# Patient Record
Sex: Male | Born: 1960 | Race: White | Hispanic: No | Marital: Married | Smoking: Never smoker
Health system: Southern US, Community
[De-identification: ages and names within clinical notes are randomized; demographics above are authoritative.]

## PROBLEM LIST (undated history)

## (undated) DIAGNOSIS — I5030 Unspecified diastolic (congestive) heart failure: Secondary | ICD-10-CM

## (undated) DIAGNOSIS — G4730 Sleep apnea, unspecified: Secondary | ICD-10-CM

## (undated) DIAGNOSIS — E785 Hyperlipidemia, unspecified: Secondary | ICD-10-CM

## (undated) DIAGNOSIS — I6529 Occlusion and stenosis of unspecified carotid artery: Secondary | ICD-10-CM

## (undated) DIAGNOSIS — I509 Heart failure, unspecified: Secondary | ICD-10-CM

## (undated) DIAGNOSIS — I872 Venous insufficiency (chronic) (peripheral): Secondary | ICD-10-CM

## (undated) DIAGNOSIS — E7800 Pure hypercholesterolemia, unspecified: Secondary | ICD-10-CM

## (undated) DIAGNOSIS — I219 Acute myocardial infarction, unspecified: Secondary | ICD-10-CM

## (undated) DIAGNOSIS — M7120 Synovial cyst of popliteal space [Baker], unspecified knee: Secondary | ICD-10-CM

## (undated) HISTORY — DX: Occlusion and stenosis of unspecified carotid artery: I65.29

## (undated) HISTORY — DX: Acute myocardial infarction, unspecified: I21.9

## (undated) HISTORY — DX: Pure hypercholesterolemia, unspecified: E78.00

## (undated) HISTORY — DX: Sleep apnea, unspecified: G47.30

## (undated) HISTORY — DX: Heart failure, unspecified: I50.9

## (undated) HISTORY — DX: Synovial cyst of popliteal space (Baker), unspecified knee: M71.20

## (undated) HISTORY — PX: KNEE SURGERY: SHX244

## (undated) HISTORY — DX: Hyperlipidemia, unspecified: E78.5

## (undated) HISTORY — DX: Venous insufficiency (chronic) (peripheral): I87.2

## (undated) HISTORY — PX: CORONARY ANGIOPLASTY: SHX604

## (undated) HISTORY — DX: Unspecified diastolic (congestive) heart failure: I50.30

---

## 2013-11-04 DIAGNOSIS — G609 Hereditary and idiopathic neuropathy, unspecified: Secondary | ICD-10-CM | POA: Insufficient documentation

## 2013-11-04 DIAGNOSIS — M5416 Radiculopathy, lumbar region: Secondary | ICD-10-CM | POA: Insufficient documentation

## 2013-11-04 HISTORY — DX: Radiculopathy, lumbar region: M54.16

## 2013-11-04 HISTORY — DX: Hereditary and idiopathic neuropathy, unspecified: G60.9

## 2014-02-13 ENCOUNTER — Ambulatory Visit: Payer: Self-pay

## 2014-02-13 ENCOUNTER — Ambulatory Visit (INDEPENDENT_AMBULATORY_CARE_PROVIDER_SITE_OTHER): Payer: BC Managed Care – PPO

## 2014-02-13 VITALS — BP 140/84 | HR 69 | Resp 12

## 2014-02-13 DIAGNOSIS — M7730 Calcaneal spur, unspecified foot: Secondary | ICD-10-CM

## 2014-02-13 DIAGNOSIS — M722 Plantar fascial fibromatosis: Secondary | ICD-10-CM

## 2014-02-13 DIAGNOSIS — R52 Pain, unspecified: Secondary | ICD-10-CM

## 2014-02-13 MED ORDER — MELOXICAM 15 MG PO TABS: 15.0000 mg | ORAL_TABLET | Freq: Every day | ORAL | Status: DC

## 2014-02-13 NOTE — Patient Instructions (Signed)

## 2014-02-13 NOTE — Progress Notes (Signed)
   Subjective:    Patient ID: Jerome Phelps, male    DOB: 08/28/1960, 53 y.o.   MRN: [id number]  HPI    Review of Systems  Musculoskeletal: Positive for joint swelling.  All other systems reviewed and are negative.      Objective:   Physical Exam 53 year old white male well-developed well-nourished considerably obese overweight presents at this time with a recurrence of heel pain left more so than right patient had orthotics in the past however cannot tolerate them they broken down in are worn out. Ask her status appears to be intact with pedal pulses palpable DP +2/4 bilateral PT one over 4 bilateral capillary refill time 3 seconds all digits mild + edema mild varicosities noted neurologically epicritic and proprioceptive sensations intact and symmetric bilateral there is normal plantar response DTRs not elicited dermatologically skin color pigment normal hair growth absent nails somewhat criptotic incurvated friable orthopedic biomechanical exam there is pain on palpation of the mid arch bilateral left much more so than right pain on first up in the morning or getting up from. Breasts is also noted the past and orthotics is not able to tolerate them they broken are worn out. Is wearing shoes or boots that may have to much flexibility or flimsy this in the mid arch also has some gel insoles rather than the orthotics in there which may have contributed to the lack of stability remainder of exam otherwise unremarkable no open wounds or ulcers no secondary infections       Assessment & Plan:  Assessment plantar fasciitis/heel spur syndrome left more so than right plan at this time fascial strapping applied to both feet prescription for [facility] is given recommended ice to the affected areas also will try to resume using orthoses patient will bring orthotics with him for next visit for just under recovering to be able to facilitate use of the orthotic again. Patient advised to avoid barefoot or flimsy  shoes or flip-flops also recommended ice to the heel every evening again recheck in a week or 2 for recheck and orthotic adjustments unavailable  Alvan Dame DPM

## 2014-02-27 ENCOUNTER — Ambulatory Visit (INDEPENDENT_AMBULATORY_CARE_PROVIDER_SITE_OTHER): Payer: BC Managed Care – PPO

## 2014-02-27 VITALS — BP 155/72 | HR 76 | Resp 12

## 2014-02-27 DIAGNOSIS — M7730 Calcaneal spur, unspecified foot: Secondary | ICD-10-CM

## 2014-02-27 DIAGNOSIS — R52 Pain, unspecified: Secondary | ICD-10-CM

## 2014-02-27 DIAGNOSIS — M722 Plantar fascial fibromatosis: Secondary | ICD-10-CM

## 2014-02-27 MED ORDER — TRIAMCINOLONE ACETONIDE 10 MG/ML IJ SUSP: 10.0000 mg | Freq: Once | INTRAMUSCULAR | Status: DC

## 2014-02-27 NOTE — Patient Instructions (Signed)
WEARING INSTRUCTIONS FOR ORTHOTICS  Don't expect to be comfortable wearing your orthotic devices for the first time.  Like eyeglasses, you may be aware of them as time passes, they will not be uncomfortable and you will enjoy wearing them.  FOLLOW THESE INSTRUCTIONS EXACTLY!  1. Wear your orthotic devices for:       Not more than 1 hour the first day.       Not more than 2 hours the second day.       Not more than 3 hours the third day and so on.        Or wear them for as long as they feel comfortable.       If you experience discomfort in your feet or legs take them out.  When feet & legs feel       better, put them back in.  You do need to be consistent and wear them a little        everyday. 2.   If at any time the orthotic devices become acutely uncomfortable before the       time for that particular day, STOP WEARING THEM. 3.   On the next day, do not increase the wearing time. 4.   Subsequently, increase the wearing time by 15-30 minutes only if comfortable to do       so. 5.   You will be seen by your doctor about 2-4 weeks after you receive your orthotic       devices, at which time you will probably be wearing your devices comfortably        for about 8 hours or more a day. 6.   Some patients occasionally report mild aches or discomfort in other parts of the of       body such as the knees, hips or back after 3 or 4 consecutive hours of wear.  If this       is the case with you, do not extend your wearing time.  Instead, cut it back an hour or       two.  In all likelihood, these symptoms will disappear in a short period of time as your       body posture realigns itself and functions more efficiently. 7.   It is possible that your orthotic device may require some small changes or adjustment       to improve their function or make them more comfortable.   This is usually not done       before one to three months have elapsed.  These adjustments are made in        accordance  with the changed position your feet are assuming as a result of       improved biomechanical function. 8.   In women's shoes, it's not unusual for your heel to slip out of the shoe, particularly if       they are step-in-shoes.  If this is the case, try other shoes or other styles.  Try to       purchase shoes which have deeper heal seats or higher heel counters. 9.   Squeaking of orthotics devices in the shoes is due to the movement of the devices       when they are functioning normally.  To eliminate squeaking, simply dust some       baby powder into your shoes before inserting the devices.  If this does not work,          apply soap or wax to the edges of the orthotic devices or put a tissue into the shoes. 10. It is important that you follow these directions explicitly.  Failure to do so will simply       prolong the adjustment period or create problems which are easily avoided.  It makes       no difference if you are wearing your orthotic devices for only a few hours after        several months, so long as you are wearing them comfortably for those hours. 11. If you have any questions or complaints, contact our office.  We have no way of       knowing about your problems unless you tell us.  If we do not hear from you, we will       assume that you are proceeding well.     ICE INSTRUCTIONS  Apply ice or cold pack to the affected area at least 3 times a day for 10-15 minutes each time.  You should also use ice after prolonged activity or vigorous exercise.  Do not apply ice longer than 20 minutes at one time.  Always keep a cloth between your skin and the ice pack to prevent burns.  Being consistent and following these instructions will help control your symptoms.  We suggest you purchase a gel ice pack because they are reusable and do bit leak.  Some of them are designed to wrap around the area.  Use the method that works best for you.  Here are some other suggestions for icing.   Use a  frozen bag of peas or corn-inexpensive and molds well to your body, usually stays frozen for 10 to 20 minutes.  Wet a towel with cold water and squeeze out the excess until it's damp.  Place in a bag in the freezer for 20 minutes. Then remove and use. 

## 2014-02-27 NOTE — Progress Notes (Signed)
   Subjective:    Patient ID: Jerome Phelps, male    DOB: 11/29/1960, 53 y.o.   MRN: [id number]  HPI ''RT FOOT HEEL IS DOING MUCH BETTER, BUT LT FOOT STILL SORE.''   Review of Systems no new findings or systemic changes noted     Objective:   Physical Exam Neurovascular status is unchanged pedal pulses are palpable the right foot is greatly improved although strapping and taping he resume using orthoses however still and hurts and tolerating on the left the orthotics are worn out and need replacing of top cover with Spenco top cover is worn through the orthotics released to your hip holder more and need replacing at this point. Patient's current cover for orthotics it's not available therefore we'll try to recover the old orthotics refurbished. Again pedal pulses are palpable epicritic and proprioceptive sensations intact mild HAV deformity and digital contractures noted there is pain on palpation medial band plantar fascia medial calcaneal tubercle on the left from heel to mid arch out varicosities and edema noted as well no other new changes are noted at this time injection tender with Kenalog 20 mg Xylocaine plain infiltrated to the inferior left heel fascial strapping is applied and maintained for 5 days as instructed continue with ice and NSAID therapies in the interim the orthotics are taken will be recovered with no Spenco top cover and by myself      Assessment & Plan:  Assessment plantar fasciitis/heel spurs interim responding to fascial strapping of both feet although right more successful left continues to have some pain tenderness discomfort mid arch base and affect more aggressive medications per steroid injections given at this time and fascial strapping reapplied orthotics to be recovered in refurbished and first [facility] patient with the next week reappointed in one month for long-term followup and reevaluation and possible additional steroid injections or other noninvasive or invasive  options based on progress  Alvan Dame DPM

## 2014-03-06 DIAGNOSIS — M722 Plantar fascial fibromatosis: Secondary | ICD-10-CM

## 2014-03-27 ENCOUNTER — Ambulatory Visit: Payer: BC Managed Care – PPO

## 2014-03-30 ENCOUNTER — Ambulatory Visit (INDEPENDENT_AMBULATORY_CARE_PROVIDER_SITE_OTHER): Payer: BC Managed Care – PPO

## 2014-03-30 VITALS — BP 133/76 | HR 84 | Resp 12

## 2014-03-30 DIAGNOSIS — M722 Plantar fascial fibromatosis: Secondary | ICD-10-CM

## 2014-03-30 DIAGNOSIS — M7730 Calcaneal spur, unspecified foot: Secondary | ICD-10-CM

## 2014-03-30 NOTE — Patient Instructions (Signed)

## 2014-03-30 NOTE — Progress Notes (Signed)
   Subjective:    Patient ID: Jerome Phelps, male    DOB: [birth year], 53 y.o.   MRN: [id number]  HPI  ''B/L HEEL ARE DOING OK, EXCEPT THE LT FOOT BOTTOM OF THE HEEL IS STILL SORE.''  Review of Systems no new findings or systemic changes noted     Objective:   Physical Exam neurovascular status is intact and unchanged pedal pulses are palpable patient is considerably overweight the right foot is doing great having had taping ease back into his refurbished orthotics orthotics fit and contour well left foot mid arch area and heel still having slight tenderness and pain special and first up in the morning although not as intense as initially based on that fact vessel strapping is reapplied at this time orthotics are examined and she fit and contour well do not feel any adjusting however if within the next month or 2 they don't resolve the situation completely we consider additional booster cortisone injection and possible new orthoses fabrication if needed      Assessment & Plan:  Assessment persistent recalcitrant plantar fasciitis left foot more so than right right is resolved with orthoses and taping at this time will continue with ice and meloxicam on left foot fascial strapping applied to left foot at this time maintain orthoses reappointed in one to 2 months if fails to resolve completely [place] additional steroid injection or new functional orthoses in the future.  Alvan Dame DPM

## 2016-11-10 ENCOUNTER — Encounter: Payer: Self-pay | Admitting: Cardiology

## 2016-11-10 ENCOUNTER — Ambulatory Visit (INDEPENDENT_AMBULATORY_CARE_PROVIDER_SITE_OTHER): Payer: 59 | Admitting: Cardiology

## 2016-11-10 VITALS — BP 126/72 | HR 60 | Resp 10 | Ht 73.0 in | Wt 367.0 lb

## 2016-11-10 DIAGNOSIS — E785 Hyperlipidemia, unspecified: Secondary | ICD-10-CM | POA: Insufficient documentation

## 2016-11-10 DIAGNOSIS — I2510 Atherosclerotic heart disease of native coronary artery without angina pectoris: Secondary | ICD-10-CM | POA: Diagnosis not present

## 2016-11-10 DIAGNOSIS — G4733 Obstructive sleep apnea (adult) (pediatric): Secondary | ICD-10-CM | POA: Insufficient documentation

## 2016-11-10 DIAGNOSIS — G4730 Sleep apnea, unspecified: Secondary | ICD-10-CM | POA: Insufficient documentation

## 2016-11-10 HISTORY — DX: Hyperlipidemia, unspecified: E78.5

## 2016-11-10 HISTORY — DX: Atherosclerotic heart disease of native coronary artery without angina pectoris: I25.10

## 2016-11-10 MED ORDER — ATORVASTATIN CALCIUM 10 MG PO TABS: 10.0000 mg | ORAL_TABLET | Freq: Every day | ORAL | 6 refills | Status: DC

## 2016-11-10 NOTE — Patient Instructions (Addendum)
Medication Instructions:  Your physician has recommended you make the following change in your medication: Dr. Bing Matter would like you to start a medication called Lipitor 10 mg daily. This will help in reducing cholesterol and reduce plaque build up in your arteries.  Labwork: Your physician recommends that you return for lab work in: today  Testing/Procedures: Your physician has requested that you have an echocardiogram. Echocardiography is a painless test that uses sound waves to create images of your heart. It provides your doctor with information about the size and shape of your heart and how well your heart's chambers and valves are working. This procedure takes approximately one hour. There are no restrictions for this procedure.  Follow-Up: Your physician recommends that you schedule a follow-up appointment in: 1 month   Any Other Special Instructions Will Be Listed Below (If Applicable).     If you need a refill on your cardiac medications before your next appointment, please call your pharmacy.

## 2016-11-10 NOTE — Progress Notes (Signed)
Cardiology Consultation:    Date:  11/10/2016   ID:  Kasin Rivest, DOB 01/26/1961, MRN [id number]  PCP:  Noni Saupe, MD  Cardiologist:  Gypsy Balsam, MD   Referring MD: Noni Saupe, MD   Chief Complaint  Patient presents with  . Coronary Artery Disease  To be reestablishes a patient with coronary artery disease  History of Present Illness:    Darcy Toor is a 56 y.o. male who is being seen today for the evaluation of Coronary artery disease at the request of [place], John F. II, MD. Apparently 4 years ago he had cardiac catheterization which showed 20% blockage in the left anterolateral descending artery. He is was referred to his back because of some contemplation about potentially doing his knee surgery. He is morbidly obese and does have significant arthritis in both knees and both need to be replaced. However, orthopedic surgeon told him he needs to lose 100 pounds in overlying to have surgery. She went on diet and already making some progress in losing weight. Does have history of chronic swelling of lower extremities. Within last few years he had bilateral DVT study done in every single time tests were normal. He does have obstructive sleep apnea and uses CPAP mask however, he did not want some improvement in the swelling of lower extremities since the time use is mask. Denies having atypical chest pain tightness squeezing pressure burning chest but his exercise tolerance is limited because severe pain in his knees. According to his wife he does not snore anymore as long as he uses CPAP mask. And he uses his CPAP must religiously. His risk factors for coronary artery disease include dyslipidemia. He never smoked. There is some family history of coronary artery disease but none premature.  Past Medical History:  Diagnosis Date  . Carotid artery occlusion   . Hyperlipidemia   . Sleep apnea   . Venous insufficiency     Past Surgical History:  Procedure Laterality  Date  . CORONARY ANGIOPLASTY     Right/Left  . KNEE SURGERY      Current Medications: Current Meds  Medication Sig  . aspirin EC 325 MG tablet Take by mouth.  . Multiple Vitamin (MULTI-VITAMINS) TABS Take 1 tablet by mouth daily.   . tamsulosin (FLOMAX) 0.4 MG CAPS capsule Take 1 capsule by mouth daily.   Current Facility-Administered Medications for the 11/10/16 encounter (Office Visit) with Georgeanna Lea, MD  Medication  . triamcinolone acetonide (KENALOG) 10 MG/ML injection 10 mg     Allergies:   Patient has no known allergies.   Social History   Social History  . Marital status: Married    Spouse name: N/A  . Number of children: N/A  . Years of education: N/A   Social History Main Topics  . Smoking status: Never Smoker  . Smokeless tobacco: Never Used  . Alcohol use No  . Drug use: No  . Sexual activity: Not Asked   Other Topics Concern  . None   Social History Narrative  . None     Family History: The patient's family history includes Lung cancer in his father; Thyroid cancer in his mother. ROS:   Please see the history of present illness.     All other systems reviewed and are negative.  EKGs/Labs/Other Studies Reviewed:    The following studies were reviewed today: Cardiac catheterization from 2014 showing luminal disease up to 20% of LAD.  EKG:  Normal sinus rhythm,  normal interval, normal. Discomfort is exertional. No ST-T segment changes.  Recent Labs: No results found for requested labs within last 8760 hours.  Recent Lipid Panel No results found for: CHOL, TRIG, HDL, CHOLHDL, VLDL, LDLCALC, LDLDIRECT  Physical Exam:    VS:  BP 126/72   Pulse 60   Resp 10   Ht 6\' 1"  (1.854 m)   Wt (!) 367 lb (166.5 kg)   BMI 48.42 kg/m     Wt Readings from Last 3 Encounters:  11/10/16 (!) 367 lb (166.5 kg)     GEN:  Well nourished, well developed in no acute distress HEENT: Normal NECK: No JVD; No carotid bruits LYMPHATICS: No  lymphadenopathy CARDIAC: RRR, no murmurs, no rubs, no gallops RESPIRATORY:  Clear to auscultation without rales, wheezing or rhonchi  ABDOMEN: Soft, non-tender, non-distended MUSCULOSKELETAL:  No edema; No deformity  SKIN: Warm and dry NEUROLOGIC:  Alert and oriented x 3 PSYCHIATRIC:  Normal affect   ASSESSMENT:    No diagnosis found. PLAN:    In order of problems listed above:  1. Coronary artery disease as proven by cardiac catheterization done 4 years ago. He did have 20% stenosis of left internal descending artery. Does not report any typical symptoms but because of these pain in both knee secondary to osteoarthritis he does not have good exercise tolerance. I think the key will be to modify his risk factors for coronary artery disease. He is already on  aspirin and advised to continue. According to guidelines he needs to be in high intensity statin. He's reluctant to go on very high dose I will initially put him only on 10 mg of Lipitor. I do have his cholesterol profile he is HDL was 50 and LDL 82. 2 Dyslipidemia. Last fasting lipid profile which was done this year show LDH 82 and HDL 50. According to guidelines he needs to be in high intensity statin I will put him on 10 mg of Lipitor initially with intention to increase the dose if he is able to tolerated. 3. Swelling of lower extremity venous. Echocardiography will be done. He does use CPAP must persist sleep apnea. I would check left ventricular ejection fraction as well as pulmonology pressure. I will ask him to have proBNP as well as a PA and aortic done. Ascription for panendoscopy to will be sent.  Medication Adjustments/Labs and Tests Ordered: Current medicines are reviewed at length with the patient today.  Concerns regarding medicines are outlined above.  No orders of the defined types were placed in this encounter.  No orders of the defined types were placed in this encounter.   Signed, Georgeanna Lea, MD,  [facility]. 11/10/2016 2:17 PM    [facility]

## 2016-11-11 LAB — BASIC METABOLIC PANEL
BUN / CREAT RATIO: 14 (ref 9–20)
BUN: 15 mg/dL (ref 6–24)
CALCIUM: 8.7 mg/dL (ref 8.7–10.2)
CO2: 22 mmol/L (ref 20–29)
CREATININE: 1.06 mg/dL (ref 0.76–1.27)
Chloride: 104 mmol/L (ref 96–106)
GFR, EST AFRICAN AMERICAN: 89 mL/min/{1.73_m2} (ref >59)
GFR, EST NON AFRICAN AMERICAN: 77 mL/min/{1.73_m2} (ref >59)
Glucose: 86 mg/dL (ref 65–99)
Potassium: 5.1 mmol/L (ref 3.5–5.2)
Sodium: 141 mmol/L (ref 134–144)

## 2016-11-11 LAB — AST: AST: 16 IU/L (ref 0–40)

## 2016-11-11 LAB — ALT: ALT: 20 IU/L (ref 0–44)

## 2016-11-11 LAB — PRO B NATRIURETIC PEPTIDE: NT-Pro BNP: 64 pg/mL (ref 0–210)

## 2016-11-23 ENCOUNTER — Ambulatory Visit (HOSPITAL_BASED_OUTPATIENT_CLINIC_OR_DEPARTMENT_OTHER)
Admission: RE | Admit: 2016-11-23 | Discharge: 2016-11-23 | Disposition: A | Discharge status: Home / Self Care | Payer: 59 | Source: Ambulatory Visit | Attending: Cardiology | Admitting: Cardiology

## 2016-11-23 DIAGNOSIS — I5030 Unspecified diastolic (congestive) heart failure: Secondary | ICD-10-CM | POA: Diagnosis not present

## 2016-11-23 DIAGNOSIS — I2510 Atherosclerotic heart disease of native coronary artery without angina pectoris: Secondary | ICD-10-CM | POA: Diagnosis not present

## 2016-11-23 DIAGNOSIS — I420 Dilated cardiomyopathy: Secondary | ICD-10-CM | POA: Diagnosis not present

## 2016-11-23 NOTE — Progress Notes (Signed)
  Echocardiogram 2D Echocardiogram has been performed.  Arvil Chaco 11/23/2016, 9:57 AM

## 2016-12-11 ENCOUNTER — Ambulatory Visit (INDEPENDENT_AMBULATORY_CARE_PROVIDER_SITE_OTHER): Payer: 59 | Admitting: Cardiology

## 2016-12-11 ENCOUNTER — Encounter: Payer: Self-pay | Admitting: Cardiology

## 2016-12-11 VITALS — BP 122/64 | HR 76 | Resp 10 | Ht 73.0 in | Wt 371.1 lb

## 2016-12-11 DIAGNOSIS — I2510 Atherosclerotic heart disease of native coronary artery without angina pectoris: Secondary | ICD-10-CM

## 2016-12-11 DIAGNOSIS — G4733 Obstructive sleep apnea (adult) (pediatric): Secondary | ICD-10-CM

## 2016-12-11 DIAGNOSIS — E785 Hyperlipidemia, unspecified: Secondary | ICD-10-CM | POA: Diagnosis not present

## 2016-12-11 HISTORY — DX: Morbid (severe) obesity due to excess calories: E66.01

## 2016-12-11 NOTE — Progress Notes (Signed)
Cardiology Office Note:    Date:  12/11/2016   ID:  Jerome Phelps, DOB 24-Jan-1961, MRN [id number]  PCP:  Noni Saupe, MD  Cardiologist:  Gypsy Balsam, MD    Referring MD: Noni Saupe, MD   Chief Complaint  Patient presents with  . 1 month follow up    History of Present Illness:    Jerome Phelps is a 56 y.o. male  with coronary artery disease. He is asymptomatic. His ability to exercise is severely limited because of his morbid obesity. He came to me because of some contemplation of knee surgery however surgeon told him that he must lose 100 pounds before having surgery. He is working on a chance she is diet and lost some weight but still frustrated with the following that he lost about 10 pounds. I started talking to him more about his diet completely he does have some understanding of the problem but not completely anything he somebody who can benefit from dietary consult. He does have motivation with some knowledge she will be able to accomplish his goal. He had echocardiogram done which I will review with him show preserved left ventricular ejection fraction, no mentioning of pulmonary hypertension number itching of TR which make me think that there is no significant critical pulmonary hypertension.  Past Medical History:  Diagnosis Date  . Carotid artery occlusion   . Hyperlipidemia   . Sleep apnea   . Venous insufficiency     Past Surgical History:  Procedure Laterality Date  . CORONARY ANGIOPLASTY     Right/Left  . KNEE SURGERY      Current Medications: No outpatient prescriptions have been marked as taking for the 12/11/16 encounter (Office Visit) with Georgeanna Lea, MD.   Current Facility-Administered Medications for the 12/11/16 encounter (Office Visit) with Georgeanna Lea, MD  Medication  . triamcinolone acetonide (KENALOG) 10 MG/ML injection 10 mg     Allergies:   Patient has no known allergies.   Social History   Social History  .  Marital status: Married    Spouse name: N/A  . Number of children: N/A  . Years of education: N/A   Social History Main Topics  . Smoking status: Never Smoker  . Smokeless tobacco: Never Used  . Alcohol use No  . Drug use: No  . Sexual activity: Not Asked   Other Topics Concern  . None   Social History Narrative  . None     Family History: The patient's family history includes Lung cancer in his father; Thyroid cancer in his mother. ROS:   Please see the history of present illness.    All 14 point review of systems negative except as described per history of present illness  EKGs/Labs/Other Studies Reviewed:      Recent Labs: 11/10/2016: ALT 20; BUN 15; Creatinine, Ser 1.06; NT-Pro BNP 64; Potassium 5.1; Sodium 141  Recent Lipid Panel No results found for: CHOL, TRIG, HDL, CHOLHDL, VLDL, LDLCALC, LDLDIRECT  Physical Exam:    VS:  BP 122/64   Pulse 76   Resp 10   Ht 6\' 1"  (1.854 m)   Wt (!) 371 lb 1.9 oz (168.3 kg)   BMI 48.96 kg/m     Wt Readings from Last 3 Encounters:  12/11/16 (!) 371 lb 1.9 oz (168.3 kg)  11/10/16 (!) 367 lb (166.5 kg)     GEN:  Well nourished, well developed in no acute distress HEENT: Normal NECK: No JVD; No carotid  bruits LYMPHATICS: No lymphadenopathy CARDIAC: RRR, no murmurs, no rubs, no gallops RESPIRATORY:  Clear to auscultation without rales, wheezing or rhonchi  ABDOMEN: Soft, non-tender, non-distended MUSCULOSKELETAL:  No edema; No deformity  SKIN: Warm and dry LOWER EXTREMITIES: no swelling NEUROLOGIC:  Alert and oriented x 3 PSYCHIATRIC:  Normal affect   ASSESSMENT:    1. Coronary artery disease involving native coronary artery of native heart without angina pectoris   2. Obstructive sleep apnea   3. Dyslipidemia   4. Morbid obesity (HCC)    PLAN:    In order of problems listed above:  1. Coronary artery disease: Asymptomatic we'll continue present management. 2. Obstructive sleep apnea: He does use must  religiously and advised to continue. 3. Dyslipidemia: Complaining of having some leg pain he thinks this is because of Lipitor asked him to continue for another 2 weeks and if it still a problem to stop Lipitor for 2 weeks to see if he is improving. 4. Morbid obesity: We'll try to find out if we can get him appointment with dietitian.   Medication Adjustments/Labs and Tests Ordered: Current medicines are reviewed at length with the patient today.  Concerns regarding medicines are outlined above.  No orders of the defined types were placed in this encounter.  Medication changes: No orders of the defined types were placed in this encounter.   Signed, Georgeanna Lea, MD, [facility] 12/11/2016 3:17 PM    [facility]

## 2016-12-11 NOTE — Patient Instructions (Signed)
Medication Instructions:  Your physician recommends that you continue on your current medications as directed. Please refer to the Current Medication list given to you today.  Labwork: None   Testing/Procedures: None    Follow-Up: Your physician recommends that you schedule a follow-up appointment in: 3 months  Your physician encouraged you to lose weight for better health.Your physician discussed the importance of regular exercise and recommended that you start or continue a regular exercise program for good health. A referral has been made to a dietician who should be contacting you.    Any Other Special Instructions Will Be Listed Below (If Applicable).  Please note that any paperwork needing to be filled out by the provider will need to be addressed at the front desk prior to seeing the provider. Please note that any paperwork FMLA, Disability or other documents regarding health condition is subject to a $25.00 charge that must be received prior to completion of paperwork.    If you need a refill on your cardiac medications before your next appointment, please call your pharmacy.

## 2016-12-11 NOTE — Addendum Note (Signed)
Addended by: Rodney Langton on: 12/11/2016 03:37 PM   Modules accepted: Orders

## 2016-12-27 ENCOUNTER — Telehealth: Payer: Self-pay | Admitting: Cardiology

## 2016-12-27 DIAGNOSIS — E785 Hyperlipidemia, unspecified: Secondary | ICD-10-CM

## 2016-12-27 NOTE — Telephone Encounter (Signed)
Paitent has been without cholesterol meds for 2 weeks that we recommended due to leg pain, He feels better and would like another chol med called to [facility] in [place].  Please call patient.

## 2016-12-29 MED ORDER — ROSUVASTATIN CALCIUM 10 MG PO TABS: 10.0000 mg | ORAL_TABLET | Freq: Every day | ORAL | 3 refills | Status: DC

## 2016-12-29 NOTE — Telephone Encounter (Signed)
Pt educated on new medication start of crestor. RX sent to pharmacy. Pt will notify the office if he is unable to tolerate it.

## 2017-03-13 ENCOUNTER — Ambulatory Visit (INDEPENDENT_AMBULATORY_CARE_PROVIDER_SITE_OTHER): Payer: 59 | Admitting: Cardiology

## 2017-03-13 ENCOUNTER — Encounter: Payer: Self-pay | Admitting: Cardiology

## 2017-03-13 VITALS — BP 130/74 | HR 68 | Resp 12 | Ht 73.0 in | Wt 366.0 lb

## 2017-03-13 DIAGNOSIS — I2510 Atherosclerotic heart disease of native coronary artery without angina pectoris: Secondary | ICD-10-CM | POA: Diagnosis not present

## 2017-03-13 DIAGNOSIS — E785 Hyperlipidemia, unspecified: Secondary | ICD-10-CM

## 2017-03-13 DIAGNOSIS — G4733 Obstructive sleep apnea (adult) (pediatric): Secondary | ICD-10-CM | POA: Diagnosis not present

## 2017-03-13 NOTE — Patient Instructions (Signed)
Medication Instructions:  Your physician recommends that you continue on your current medications as directed. Please refer to the Current Medication list given to you today.  Labwork: Your physician recommends that you return for a FASTING lipid profile: 6 weeks   Testing/Procedures: None   Follow-Up: Your physician wants you to follow-up in: 6 months. You will receive a reminder letter in the mail two months in advance. If you don't receive a letter, please call our office to schedule the follow-up appointment.  Any Other Special Instructions Will Be Listed Below (If Applicable).  Please note that any paperwork needing to be filled out by the provider will need to be addressed at the front desk prior to seeing the provider. Please note that any paperwork FMLA, Disability or other documents regarding health condition is subject to a $25.00 charge that must be received prior to completion of paperwork in the form of a money order or check.    If you need a refill on your cardiac medications before your next appointment, please call your pharmacy.

## 2017-03-13 NOTE — Progress Notes (Signed)
Cardiology Office Note:    Date:  03/13/2017   ID:  Jerome Phelps, DOB 1960/05/10, MRN [id number]  PCP:  Noni Saupe, MD  Cardiologist:  Gypsy Balsam, MD    Referring MD: Noni Saupe, MD   Chief Complaint  Patient presents with  . Follow-up  Doing better  History of Present Illness:    Nachmen Mitschke is a 56 y.o. male with coronary artery disease.  Overall he is doing well cardiac wise denies having any chest pain tightness squeezing pressure burning chest no palpitations no dizziness no swelling of lower extremities.  Working on weight loss then he lost significant amount of weight already feels better less pain in his knees, shortness of breath.  Overall he is doing well.  Complaint of having some soreness in his legs while taking Crestor 10 every other day I told him to reduce Crestor to 5 mg but take it every day.  They will follow-up he is fasting lipid profile.  Past Medical History:  Diagnosis Date  . Carotid artery occlusion   . Hyperlipidemia   . Sleep apnea   . Venous insufficiency     Past Surgical History:  Procedure Laterality Date  . CORONARY ANGIOPLASTY     Right/Left  . KNEE SURGERY      Current Medications: Current Meds  Medication Sig  . aspirin EC 325 MG tablet Take by mouth.  . Multiple Vitamin (MULTI-VITAMINS) TABS Take 1 tablet by mouth daily.   . rosuvastatin (CRESTOR) 10 MG tablet Take 1 tablet (10 mg total) by mouth daily. (Patient taking differently: Take 10 mg every other day by mouth. )  . tamsulosin (FLOMAX) 0.4 MG CAPS capsule Take 1 capsule by mouth daily.   Current Facility-Administered Medications for the 03/13/17 encounter (Office Visit) with Georgeanna Lea, MD  Medication  . triamcinolone acetonide (KENALOG) 10 MG/ML injection 10 mg     Allergies:   Patient has no known allergies.   Social History   Socioeconomic History  . Marital status: Married    Spouse name: None  . Number of children: None  . Years of  education: None  . Highest education level: None  Social Needs  . Financial resource strain: None  . Food insecurity - worry: None  . Food insecurity - inability: None  . Transportation needs - medical: None  . Transportation needs - non-medical: None  Occupational History  . None  Tobacco Use  . Smoking status: Never Smoker  . Smokeless tobacco: Never Used  Substance and Sexual Activity  . Alcohol use: No  . Drug use: No  . Sexual activity: None  Other Topics Concern  . None  Social History Narrative  . None     Family History: The patient's family history includes Lung cancer in his father; Thyroid cancer in his mother. ROS:   Please see the history of present illness.    All 14 point review of systems negative except as described per history of present illness  EKGs/Labs/Other Studies Reviewed:      Recent Labs: 11/10/2016: ALT 20; BUN 15; Creatinine, Ser 1.06; NT-Pro BNP 64; Potassium 5.1; Sodium 141  Recent Lipid Panel No results found for: CHOL, TRIG, HDL, CHOLHDL, VLDL, LDLCALC, LDLDIRECT  Physical Exam:    VS:  BP 130/74   Pulse 68   Resp 12   Ht 6\' 1"  (1.854 m)   Wt (!) 366 lb (166 kg)   BMI 48.29 kg/m  Wt Readings from Last 3 Encounters:  03/13/17 (!) 366 lb (166 kg)  12/11/16 (!) 371 lb 1.9 oz (168.3 kg)  11/10/16 (!) 367 lb (166.5 kg)     GEN:  Well nourished, well developed in no acute distress HEENT: Normal NECK: No JVD; No carotid bruits LYMPHATICS: No lymphadenopathy CARDIAC: RRR, no murmurs, no rubs, no gallops RESPIRATORY:  Clear to auscultation without rales, wheezing or rhonchi  ABDOMEN: Soft, non-tender, non-distended MUSCULOSKELETAL:  No edema; No deformity  SKIN: Warm and dry LOWER EXTREMITIES: no swelling NEUROLOGIC:  Alert and oriented x 3 PSYCHIATRIC:  Normal affect   ASSESSMENT:    1. Coronary artery disease involving native coronary artery of native heart without angina pectoris   2. Obstructive sleep apnea   3.  Dyslipidemia   4. Morbid obesity (HCC)    PLAN:    In order of problems listed above:  1. Coronary artery disease: Doing well asymptomatic continue present management 2. Selective sleep apnea: Follow-up by primary care physicians 3. Dyslipidemia: He is going to take 5 mg Crestor every day we will check fasting lipid profile in 6 weeks 4. Morbid obesity: Working on losing weight and making some progress.   Medication Adjustments/Labs and Tests Ordered: Current medicines are reviewed at length with the patient today.  Concerns regarding medicines are outlined above.  No orders of the defined types were placed in this encounter.  Medication changes: No orders of the defined types were placed in this encounter.   Signed, Georgeanna Leaobert J. Sung [place], MD, [facility] Center[facility]ACC 03/13/2017 2:16 PM    [facility]

## 2017-05-29 DIAGNOSIS — G8929 Other chronic pain: Secondary | ICD-10-CM

## 2017-05-29 HISTORY — DX: Other chronic pain: G89.29

## 2019-03-24 DIAGNOSIS — E786 Lipoprotein deficiency: Secondary | ICD-10-CM | POA: Insufficient documentation

## 2019-03-24 HISTORY — DX: Lipoprotein deficiency: E78.6

## 2019-04-14 ENCOUNTER — Ambulatory Visit: Payer: 59 | Admitting: Cardiology

## 2019-06-12 ENCOUNTER — Other Ambulatory Visit: Payer: Self-pay

## 2019-06-12 ENCOUNTER — Ambulatory Visit (INDEPENDENT_AMBULATORY_CARE_PROVIDER_SITE_OTHER): Payer: 59 | Admitting: Cardiology

## 2019-06-12 ENCOUNTER — Encounter: Payer: Self-pay | Admitting: Cardiology

## 2019-06-12 VITALS — BP 126/78 | HR 71 | Ht 73.0 in | Wt 312.8 lb

## 2019-06-12 DIAGNOSIS — E785 Hyperlipidemia, unspecified: Secondary | ICD-10-CM | POA: Diagnosis not present

## 2019-06-12 DIAGNOSIS — G4733 Obstructive sleep apnea (adult) (pediatric): Secondary | ICD-10-CM | POA: Diagnosis not present

## 2019-06-12 DIAGNOSIS — I2510 Atherosclerotic heart disease of native coronary artery without angina pectoris: Secondary | ICD-10-CM | POA: Diagnosis not present

## 2019-06-12 NOTE — Patient Instructions (Signed)
Medication Instructions:  Your physician recommends that you continue on your current medications as directed. Please refer to the Current Medication list given to you today.  *If you need a refill on your cardiac medications before your next appointment, please call your pharmacy*  Lab Work: None.  If you have labs (blood work) drawn today and your tests are completely normal, you will receive your results only by: Marland Kitchen MyChart Message (if you have MyChart) OR . A paper copy in the mail If you have any lab test that is abnormal or we need to change your treatment, we will call you to review the results.  Testing/Procedures: Your physician has requested that you have an echocardiogram. Echocardiography is a painless test that uses sound waves to create images of your heart. It provides your doctor with information about the size and shape of your heart and how well your heart's chambers and valves are working. This procedure takes approximately one hour. There are no restrictions for this procedure.  Your physician has requested that you have a carotid duplex. This test is an ultrasound of the carotid arteries in your neck. It looks at blood flow through these arteries that supply the brain with blood. Allow one hour for this exam. There are no restrictions or special instructions.  Your physician has requested that you have a lexiscan myoview. For further information please visit [email or web address]. Please follow instruction sheet, as given.    Follow-Up: At [facility], you and your health needs are our priority.  As part of our continuing mission to provide you with exceptional heart care, we have created designated Provider Care Teams.  These Care Teams include your primary Cardiologist (physician) and Advanced Practice Providers (APPs -  Physician Assistants and Nurse Practitioners) who all work together to provide you with the care you need, when you need it.  Your next appointment:     3 month(s)  The format for your next appointment:   In Person  Provider:   Jenne Campus, MD  Other Instructions   Echocardiogram An echocardiogram is a procedure that uses painless sound waves (ultrasound) to produce an image of the heart. Images from an echocardiogram can provide important information about:  Signs of coronary artery disease (CAD).  Aneurysm detection. An aneurysm is a weak or damaged part of an artery wall that bulges out from the normal force of blood pumping through the body.  Heart size and shape. Changes in the size or shape of the heart can be associated with certain conditions, including heart failure, aneurysm, and CAD.  Heart muscle function.  Heart valve function.  Signs of a past heart attack.  Fluid buildup around the heart.  Thickening of the heart muscle.  A tumor or infectious growth around the heart valves. Tell a health care provider about:  Any allergies you have.  All medicines you are taking, including vitamins, herbs, eye drops, creams, and over-the-counter medicines.  Any blood disorders you have.  Any surgeries you have had.  Any medical conditions you have.  Whether you are pregnant or may be pregnant. What are the risks? Generally, this is a safe procedure. However, problems may occur, including:  Allergic reaction to dye (contrast) that may be used during the procedure. What happens before the procedure? No specific preparation is needed. You may eat and drink normally. What happens during the procedure?   An IV tube may be inserted into one of your veins.  You may receive contrast through this tube.  A contrast is an injection that improves the quality of the pictures from your heart.  A gel will be applied to your chest.  A wand-like tool (transducer) will be moved over your chest. The gel will help to transmit the sound waves from the transducer.  The sound waves will harmlessly bounce off of your heart  to allow the heart images to be captured in real-time motion. The images will be recorded on a computer. The procedure may vary among health care providers and hospitals. What happens after the procedure?  You may return to your normal, everyday life, including diet, activities, and medicines, unless your health care provider tells you not to do that. Summary  An echocardiogram is a procedure that uses painless sound waves (ultrasound) to produce an image of the heart.  Images from an echocardiogram can provide important information about the size and shape of your heart, heart muscle function, heart valve function, and fluid buildup around your heart.  You do not need to do anything to prepare before this procedure. You may eat and drink normally.  After the echocardiogram is completed, you may return to your normal, everyday life, unless your health care provider tells you not to do that. This information is not intended to replace advice given to you by your health care provider. Make sure you discuss any questions you have with your health care provider. Document Revised: 08/15/2018 Document Reviewed: 05/27/2016 Elsevier Patient Education  2020 Elsevier Inc.   Cardiac Nuclear Scan A cardiac nuclear scan is a test that measures blood flow to the heart when a person is resting and when he or she is exercising. The test looks for problems such as: Not enough blood reaching a portion of the heart. The heart muscle not working normally. You may need this test if: You have heart disease. You have had abnormal lab results. You have had heart surgery or a balloon procedure to open up blocked arteries (angioplasty). You have chest pain. You have shortness of breath. In this test, a radioactive dye (tracer) is injected into your bloodstream. After the tracer has traveled to your heart, an imaging device is used to measure how much of the tracer is absorbed by or distributed to various areas  of your heart. This procedure is usually done at a hospital and takes 2-4 hours. Tell a health care provider about: Any allergies you have. All medicines you are taking, including vitamins, herbs, eye drops, creams, and over-the-counter medicines. Any problems you or family members have had with anesthetic medicines. Any blood disorders you have. Any surgeries you have had. Any medical conditions you have. Whether you are pregnant or may be pregnant. What are the risks? Generally, this is a safe procedure. However, problems may occur, including: Serious chest pain and heart attack. This is only a risk if the stress portion of the test is done. Rapid heartbeat. Sensation of warmth in your chest. This usually passes quickly. Allergic reaction to the tracer. What happens before the procedure? Ask your health care provider about changing or stopping your regular medicines. This is especially important if you are taking diabetes medicines or blood thinners. Follow instructions from your health care provider about eating or drinking restrictions. Remove your jewelry on the day of the procedure. What happens during the procedure? An IV will be inserted into one of your veins. Your health care provider will inject a small amount of radioactive tracer through the IV. You will wait for 20-40 minutes while  the tracer travels through your bloodstream. Your heart activity will be monitored with an electrocardiogram (ECG). You will lie down on an exam table. Images of your heart will be taken for about 15-20 minutes. You may also have a stress test. For this test, one of the following may be done: You will exercise on a treadmill or stationary bike. While you exercise, your heart's activity will be monitored with an ECG, and your blood pressure will be checked. You will be given medicines that will increase blood flow to parts of your heart. This is done if you are unable to exercise. When blood flow  to your heart has peaked, a tracer will again be injected through the IV. After 20-40 minutes, you will get back on the exam table and have more images taken of your heart. Depending on the type of tracer used, scans may need to be repeated 3-4 hours later. Your IV line will be removed when the procedure is over. The procedure may vary among health care providers and hospitals. What happens after the procedure? Unless your health care provider tells you otherwise, you may return to your normal schedule, including diet, activities, and medicines. Unless your health care provider tells you otherwise, you may increase your fluid intake. This will help to flush the contrast dye from your body. Drink enough fluid to keep your urine pale yellow. Ask your health care provider, or the department that is doing the test: When will my results be ready? How will I get my results? Summary A cardiac nuclear scan measures the blood flow to the heart when a person is resting and when he or she is exercising. Tell your health care provider if you are pregnant. Before the procedure, ask your health care provider about changing or stopping your regular medicines. This is especially important if you are taking diabetes medicines or blood thinners. After the procedure, unless your health care provider tells you otherwise, increase your fluid intake. This will help flush the contrast dye from your body. After the procedure, unless your health care provider tells you otherwise, you may return to your normal schedule, including diet, activities, and medicines. This information is not intended to replace advice given to you by your health care provider. Make sure you discuss any questions you have with your health care provider. Document Revised: 10/08/2017 Document Reviewed: 10/08/2017 Elsevier Patient Education  2020 [employer].

## 2019-06-12 NOTE — Addendum Note (Signed)
Addended by: Lita Mains on: 06/12/2019 03:49 PM   Modules accepted: Orders

## 2019-06-12 NOTE — Progress Notes (Signed)
Cardiology Office Note:    Date:  06/12/2019   ID:  Jerome Phelps, DOB [birth year], MRN [id number]  PCP:  Noni Saupe, MD  Cardiologist:  Gypsy Balsam, MD    Referring MD: Noni Saupe, MD   Chief Complaint  Patient presents with  . Follow-up    Surgical Clearance     History of Present Illness:    Jerome Phelps is a 59 y.o. male past medical history significant for coronary artery disease.  6 years ago he had cardiac catheterization done which showed 20% left anterior descending artery.  He is morbidly obese, does have hypertension, hyperlipidemia.  Comes to my office because he is contemplating having left knee replacement surgery.  He does have difficulty walking and moving around because of pain in his knee.  Therefore, I cannot determine his exercise tolerance.  Denies have any chest pain, tightness, squeezing, pressure, burning in the chest.  Past Medical History:  Diagnosis Date  . Carotid artery occlusion   . Hyperlipidemia   . Sleep apnea   . Venous insufficiency     Past Surgical History:  Procedure Laterality Date  . CORONARY ANGIOPLASTY     Right/Left  . KNEE SURGERY      Current Medications: Current Meds  Medication Sig  . aspirin EC 325 MG tablet Take by mouth.  . meloxicam (MOBIC) 15 MG tablet Take 15 mg by mouth daily.  . Multiple Vitamin (MULTI-VITAMINS) TABS Take 1 tablet by mouth daily.   . phentermine 30 MG capsule Take 30 mg by mouth. Take 1/2 tablet once daily   Current Facility-Administered Medications for the 06/12/19 encounter (Office Visit) with Georgeanna Lea, MD  Medication  . triamcinolone acetonide (KENALOG) 10 MG/ML injection 10 mg     Allergies:   Patient has no known allergies.   Social History   Socioeconomic History  . Marital status: Married    Spouse name: Not on file  . Number of children: Not on file  . Years of education: Not on file  . Highest education level: Not on file  Occupational History  . Not  on file  Tobacco Use  . Smoking status: Never Smoker  . Smokeless tobacco: Never Used  Substance and Sexual Activity  . Alcohol use: No  . Drug use: No  . Sexual activity: Not on file  Other Topics Concern  . Not on file  Social History Narrative  . Not on file   Social Determinants of Health   Financial Resource Strain:   . Difficulty of Paying Living Expenses: Not on file  Food Insecurity:   . Worried About Programme researcher, broadcasting/film/video in the Last Year: Not on file  . Ran Out of Food in the Last Year: Not on file  Transportation Needs:   . Lack of Transportation (Medical): Not on file  . Lack of Transportation (Non-Medical): Not on file  Physical Activity:   . Days of Exercise per Week: Not on file  . Minutes of Exercise per Session: Not on file  Stress:   . Feeling of Stress : Not on file  Social Connections:   . Frequency of Communication with Friends and Family: Not on file  . Frequency of Social Gatherings with Friends and Family: Not on file  . Attends Religious Services: Not on file  . Active Member of Clubs or Organizations: Not on file  . Attends Banker Meetings: Not on file  . Marital Status: Not on file  Family History: The patient's family history includes Lung cancer in his father; Thyroid cancer in his mother. ROS:   Please see the history of present illness.    All 14 point review of systems negative except as described per history of present illness  EKGs/Labs/Other Studies Reviewed:      Recent Labs: No results found for requested labs within last 8760 hours.  Recent Lipid Panel No results found for: CHOL, TRIG, HDL, CHOLHDL, VLDL, LDLCALC, LDLDIRECT  Physical Exam:    VS:  BP 126/78   Pulse 71   Ht 6\' 1"  (1.854 m)   Wt (!) 312 lb 12.8 oz (141.9 kg)   SpO2 97%   BMI 41.27 kg/m     Wt Readings from Last 3 Encounters:  06/12/19 (!) 312 lb 12.8 oz (141.9 kg)  03/13/17 (!) 366 lb (166 kg)  12/11/16 (!) 371 lb 1.9 oz (168.3 kg)       GEN:  Well nourished, well developed in no acute distress HEENT: Normal NECK: No JVD; No carotid bruits LYMPHATICS: No lymphadenopathy CARDIAC: RRR, no murmurs, no rubs, no gallops RESPIRATORY:  Clear to auscultation without rales, wheezing or rhonchi  ABDOMEN: Soft, non-tender, non-distended MUSCULOSKELETAL:  No edema; No deformity  SKIN: Warm and dry LOWER EXTREMITIES: no swelling NEUROLOGIC:  Alert and oriented x 3 PSYCHIATRIC:  Normal affect   ASSESSMENT:    1. Coronary artery disease involving native coronary artery of native heart without angina pectoris   2. Morbid obesity ([place])   3. Dyslipidemia   4. Obstructive sleep apnea    PLAN:    In order of problems listed above:  1. Preop evaluation for this gentleman with coronary artery disease diagnosed 6 years ago with 20% of LAD.  Because of multiple risk factors for coronary artery disease as well as poor exercise tolerance I think it would be prudent to pursue stress testing.  Therefore, he will be scheduled to have [place].  As a part of evaluation I will also ask him to have an echocardiogram to assess left ventricle ejection fraction more importantly to look at the right ventricle size because of significant sleep apnea. 2.  Cardiac arterial disease.  I will schedule him to have carotid duplex. 3.  Dyslipidemia followed by primary care physician he is already on statin in form of Crestor 10 mg daily. 4.  Obstructive sleep apnea does use CPAP mask on a regular basis.  Medication Adjustments/Labs and Tests Ordered: Current medicines are reviewed at length with the patient today.  Concerns regarding medicines are outlined above.  No orders of the defined types were placed in this encounter.  Medication changes: No orders of the defined types were placed in this encounter.   Signed, Park Liter, MD, [facility] 06/12/2019 3:37 PM    [place]

## 2019-06-18 ENCOUNTER — Telehealth (HOSPITAL_COMMUNITY): Payer: Self-pay

## 2019-06-18 NOTE — Telephone Encounter (Signed)
Encounter complete. 

## 2019-06-19 ENCOUNTER — Ambulatory Visit (HOSPITAL_BASED_OUTPATIENT_CLINIC_OR_DEPARTMENT_OTHER)
Admission: RE | Admit: 2019-06-19 | Discharge: 2019-06-19 | Disposition: A | Discharge status: Home / Self Care | Payer: 59 | Source: Ambulatory Visit | Attending: Cardiology | Admitting: Cardiology

## 2019-06-19 ENCOUNTER — Other Ambulatory Visit: Payer: Self-pay

## 2019-06-19 DIAGNOSIS — I2510 Atherosclerotic heart disease of native coronary artery without angina pectoris: Secondary | ICD-10-CM

## 2019-06-19 DIAGNOSIS — E785 Hyperlipidemia, unspecified: Secondary | ICD-10-CM | POA: Diagnosis present

## 2019-06-19 NOTE — Progress Notes (Signed)
  Echocardiogram 2D Echocardiogram has been performed.  Jerome Phelps 06/19/2019, 11:32 AM

## 2019-06-19 NOTE — Progress Notes (Signed)
VAS US CAROTID DUPLEX BILATERAL     06/19/19 Sinda Du RDCS, RVT

## 2019-06-24 ENCOUNTER — Ambulatory Visit (HOSPITAL_COMMUNITY)
Admission: RE | Admit: 2019-06-24 | Discharge: 2019-06-24 | Disposition: A | Discharge status: Home / Self Care | Payer: 59 | Source: Ambulatory Visit | Attending: Cardiovascular Disease | Admitting: Cardiovascular Disease

## 2019-06-24 ENCOUNTER — Other Ambulatory Visit: Payer: Self-pay

## 2019-06-24 DIAGNOSIS — E785 Hyperlipidemia, unspecified: Secondary | ICD-10-CM | POA: Diagnosis present

## 2019-06-24 DIAGNOSIS — I2510 Atherosclerotic heart disease of native coronary artery without angina pectoris: Secondary | ICD-10-CM | POA: Diagnosis present

## 2019-06-24 MED ORDER — REGADENOSON 0.4 MG/5ML IV SOLN
0.4000 mg | Freq: Once | INTRAVENOUS | Status: AC
  Administered 2019-06-24: 0.4 mg via INTRAVENOUS

## 2019-06-24 MED ORDER — TECHNETIUM TC 99M TETROFOSMIN IV KIT
26.2000 | PACK | Freq: Once | INTRAVENOUS | Status: AC | PRN
  Administered 2019-06-24: 26.2 via INTRAVENOUS
  Filled 2019-06-24: qty 27

## 2019-06-25 ENCOUNTER — Ambulatory Visit (HOSPITAL_COMMUNITY)
Admission: RE | Admit: 2019-06-25 | Discharge: 2019-06-25 | Disposition: A | Discharge status: Home / Self Care | Payer: 59 | Source: Ambulatory Visit | Attending: Cardiology | Admitting: Cardiology

## 2019-06-25 LAB — MYOCARDIAL PERFUSION IMAGING
LV dias vol: 145 mL (ref 62–150)
LV sys vol: 54 mL
Peak HR: 98 {beats}/min
Rest HR: 69 {beats}/min
SDS: 3
SRS: 0
SSS: 3
TID: 0.79

## 2019-06-25 MED ORDER — TECHNETIUM TC 99M TETROFOSMIN IV KIT
31.8000 | PACK | Freq: Once | INTRAVENOUS | Status: AC | PRN
  Administered 2019-06-25: 31.8 via INTRAVENOUS

## 2019-06-27 ENCOUNTER — Telehealth: Payer: Self-pay | Admitting: Cardiology

## 2019-06-27 ENCOUNTER — Encounter: Payer: Self-pay | Admitting: *Deleted

## 2019-06-27 NOTE — Telephone Encounter (Signed)
Advised patient of results   Jerome Lea, MD  06/26/2019 4:28 PM EST    Stress test was normal   Patient requesting cardiac clearance be sent to [facility] in [place] clearing him for his left knee replacement   Will forward to Dr Bing Matter

## 2019-06-27 NOTE — Telephone Encounter (Signed)
He is cleared for surgery from cardiac standpoint of view, echocardiogram was normal, stress test showed no ischemia.  He is cleared for surgery

## 2019-06-27 NOTE — Telephone Encounter (Signed)
   Primary Cardiologist: No primary care provider on file.  Chart reviewed as part of pre-operative protocol coverage. Per Dr. Bing Matter and based on ACC/AHA guidelines, Jerome Phelps would be at acceptable risk for the planned procedure without further cardiovascular testing.   Callback: Please clarify where this clearance should be sent and fax it to the office as requested. Thank you!  Beatriz Stallion, PA-C 06/27/2019, 5:16 PM

## 2019-06-27 NOTE — Telephone Encounter (Signed)
New Message   Patient is calling to obtain results for stress test. He is unable to view on mychart.

## 2019-06-30 NOTE — Telephone Encounter (Signed)
I called orthopedic office and confirmed the surgeon is Dr. Sherlean Foot and fax number is [phone]. I will send note to Pre op provider so that clearance may be over to Dr. Sherlean Foot. I will remove from the pre op call back pool.

## 2019-09-11 DIAGNOSIS — Z96652 Presence of left artificial knee joint: Secondary | ICD-10-CM | POA: Insufficient documentation

## 2019-09-11 HISTORY — DX: Presence of left artificial knee joint: Z96.652

## 2020-01-06 ENCOUNTER — Other Ambulatory Visit: Payer: Self-pay | Admitting: Sports Medicine

## 2020-01-06 ENCOUNTER — Ambulatory Visit: Payer: 59 | Admitting: Sports Medicine

## 2020-01-06 ENCOUNTER — Encounter: Payer: Self-pay | Admitting: Sports Medicine

## 2020-01-06 ENCOUNTER — Other Ambulatory Visit: Payer: Self-pay

## 2020-01-06 DIAGNOSIS — M779 Enthesopathy, unspecified: Secondary | ICD-10-CM | POA: Diagnosis not present

## 2020-01-06 DIAGNOSIS — M21621 Bunionette of right foot: Secondary | ICD-10-CM | POA: Diagnosis not present

## 2020-01-06 DIAGNOSIS — M21622 Bunionette of left foot: Secondary | ICD-10-CM

## 2020-01-06 DIAGNOSIS — M79671 Pain in right foot: Secondary | ICD-10-CM | POA: Diagnosis not present

## 2020-01-06 DIAGNOSIS — M79672 Pain in left foot: Secondary | ICD-10-CM

## 2020-01-06 DIAGNOSIS — M792 Neuralgia and neuritis, unspecified: Secondary | ICD-10-CM

## 2020-01-06 MED ORDER — GABAPENTIN 300 MG PO CAPS: 300.0000 mg | ORAL_CAPSULE | Freq: Every day | ORAL | 3 refills | Status: DC

## 2020-01-06 NOTE — Progress Notes (Signed)
Subjective: Jerome Phelps is a 59 y.o. male patient who presents to office for evaluation of left greater than right foot pain. Patient complains of progressive pain especially over the last month of a burning sensation to both feet pain is 10 out of 10 when it hits but comes and goes and only lasts for a few seconds at most 1 hour worse with work boots feels better with his tennis shoes.  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  Patient reports that sometimes when he is driving he notices that he tends to rest his foot on the side which could be aggravating the nerve so he tried to change his position while driving and remove his wallet from his back pocket to see if this will give some relief and has not noticed any difference.  Patient denies any history of neuropathy but there is a diagnosis from 2015 in his chart and he was previously given but does not recall the complete reasoning as to why and does not have any significant history other than plantar fasciitis of which he was treated many years ago for and reports that he had an soles that were made but that never got rid of his problem only changing to the work shoes seem to help his problem at that time.  Patient denies any other pedal complaints. Denies injury/trip/fall/sprain/any causative factors.   Review of systems noncontributory.  Patient Active Problem List   Diagnosis Date Noted   S/P TKR (total knee replacement) using cement, left 09/11/2019   Morbid obesity (HCC) 12/11/2016   Coronary artery disease 11/10/2016   Obstructive sleep apnea 11/10/2016   Dyslipidemia 11/10/2016   Lumbar radiculopathy 11/04/2013   Peripheral neuropathy, idiopathic 11/04/2013    Current Outpatient Medications on File Prior to Visit  Medication Sig Dispense Refill   aspirin EC 325 MG tablet Take by mouth.     meloxicam (MOBIC) 15 MG tablet Take 15 mg by mouth daily.      Multiple Vitamin (MULTI-VITAMINS) TABS Take 1 tablet by mouth daily.      Current Facility-Administered Medications on File Prior to Visit  Medication Dose Route Frequency Provider Last Rate Last Admin   triamcinolone acetonide (KENALOG) 10 MG/ML injection 10 mg  10 mg Other Once Alvan Dame, DPM        No Known Allergies  Objective:  General: Alert and oriented x3 in no acute distress  Dermatology: No open lesions bilateral lower extremities, no webspace macerations, no ecchymosis bilateral, all nails x 10 are well manicured.  Vascular: Dorsalis Pedis and Posterior Tibial pedal pulses palpable, Capillary Fill Time 3 seconds,(+) pedal hair growth bilateral, no edema bilateral lower extremities, Temperature gradient within normal limits.  Neurology: Michaell Cowing sensation intact via light touch bilateral.  Musculoskeletal: Mild tenderness with palpation at left and right foot worse at the 5th metatarsophalangeal joint with minimal reactive keratosis bilateral and minimal tailor's bunion deformity.  Gait: Supinated position of foot with increased lateral wear on his current shoes  Assessment and Plan: Problem List Items Addressed This Visit    None    Visit Diagnoses    Neuritis    -  Primary   Foot pain, bilateral       Capsulitis       Tailor's bunion of both feet          -Complete examination performed -Patient declined x-rays at this visit -Discussed treatement  options for likely neuritis with secondary capsulitis since patient over favors lateral overload -Rx gabapentin to take as instructed for neuritis -Dispense silicone offloading padding of the lateral foot to see if this will help reduce any plantar pressures and advised patient to come to office next visit and bring his proximal tennis shoes and his work shoes for me to further evaluate them -Patient to return to office as scheduled or sooner if condition worsens.  Asencion Islam, DPM

## 2020-02-03 ENCOUNTER — Other Ambulatory Visit: Payer: Self-pay

## 2020-02-03 ENCOUNTER — Encounter: Payer: Self-pay | Admitting: Sports Medicine

## 2020-02-03 ENCOUNTER — Ambulatory Visit: Payer: 59 | Admitting: Sports Medicine

## 2020-02-03 DIAGNOSIS — M779 Enthesopathy, unspecified: Secondary | ICD-10-CM

## 2020-02-03 DIAGNOSIS — M79672 Pain in left foot: Secondary | ICD-10-CM

## 2020-02-03 DIAGNOSIS — M21621 Bunionette of right foot: Secondary | ICD-10-CM

## 2020-02-03 DIAGNOSIS — M79671 Pain in right foot: Secondary | ICD-10-CM

## 2020-02-03 DIAGNOSIS — M792 Neuralgia and neuritis, unspecified: Secondary | ICD-10-CM

## 2020-02-03 DIAGNOSIS — M21622 Bunionette of left foot: Secondary | ICD-10-CM

## 2020-02-03 NOTE — Progress Notes (Signed)
Subjective: Jerome Phelps is a 59 y.o. male patient who returns to office for evaluation of left greater than right foot pain. Patient reports burning pain has subsided to both feet reports that he waited for 2 weeks before starting the gabapentin and after starting it did not really help the burning pain and also reports that around that time he also went for new work boots and reports that he found some boots that do not have insulation that seems to be very helpful.  Patient denies any other pedal complaints at this time.  Patient Active Problem List   Diagnosis Date Noted  . S/P TKR (total knee replacement) using cement, left 09/11/2019  . Morbid obesity (HCC) 12/11/2016  . Coronary artery disease 11/10/2016  . Obstructive sleep apnea 11/10/2016  . Dyslipidemia 11/10/2016  . Lumbar radiculopathy 11/04/2013  . Peripheral neuropathy, idiopathic 11/04/2013    Current Outpatient Medications on File Prior to Visit  Medication Sig Dispense Refill  . aspirin EC 325 MG tablet Take by mouth.    . gabapentin (NEURONTIN) 300 MG capsule Take 1 capsule (300 mg total) by mouth at bedtime. 90 capsule 3  . meloxicam (MOBIC) 15 MG tablet Take 15 mg by mouth daily.    . Multiple Vitamin (MULTI-VITAMINS) TABS Take 1 tablet by mouth daily.      Current Facility-Administered Medications on File Prior to Visit  Medication Dose Route Frequency Provider Last Rate Last Admin  . triamcinolone acetonide (KENALOG) 10 MG/ML injection 10 mg  10 mg Other Once Alvan Dame, DPM        No Known Allergies  Objective:  General: Alert and oriented x3 in no acute distress  Dermatology: No open lesions bilateral lower extremities, no webspace macerations, no ecchymosis bilateral, all nails x 10 are well manicured.  Vascular: Dorsalis Pedis and Posterior Tibial pedal pulses palpable, Capillary Fill Time 3 seconds,(+) pedal hair growth bilateral, no edema bilateral lower extremities, Temperature gradient within  normal limits.  Neurology: Michaell Cowing sensation intact via light touch bilateral.  Musculoskeletal: No reproducible tenderness with palpation at left and right foot worse at the 5th metatarsophalangeal joint with minimal reactive keratosis bilateral and minimal tailor's bunion deformity.  Assessment and Plan: Problem List Items Addressed This Visit    None    Visit Diagnoses    Neuritis    -  Primary   Capsulitis       Foot pain, bilateral       Tailor's bunion of both feet          -Complete examination performed -Re-discussed treatment options for neuritis with capsulitis bilateral foot pain  -Continue with gabapentin to take as instructed for neuritis -Continue with good supportive shoes and advised patient may benefit from custom insoles if they fail to provide additional adequate support however at this time his current work boots seem to be adequate -Patient to return to office in 3 months for medication check or sooner if condition worsens.  Advised patient if he is doing well next visit may slowly wean him from his gabapentin however at this time he must continue to take 300 mg at bedtime if pain worsens may call office to notify me so that way I can instruct him safely how to titrate up on gabapentin if he does not continue to get relief with this medication.  Asencion Islam, DPM

## 2020-05-04 ENCOUNTER — Ambulatory Visit: Payer: 59 | Admitting: Sports Medicine

## 2020-05-12 ENCOUNTER — Ambulatory Visit: Payer: 59 | Admitting: Sports Medicine

## 2021-03-23 DIAGNOSIS — I6529 Occlusion and stenosis of unspecified carotid artery: Secondary | ICD-10-CM | POA: Insufficient documentation

## 2021-03-23 DIAGNOSIS — I872 Venous insufficiency (chronic) (peripheral): Secondary | ICD-10-CM | POA: Insufficient documentation

## 2021-03-25 ENCOUNTER — Other Ambulatory Visit: Payer: Self-pay

## 2021-03-25 ENCOUNTER — Ambulatory Visit (INDEPENDENT_AMBULATORY_CARE_PROVIDER_SITE_OTHER): Payer: 59 | Admitting: Cardiology

## 2021-03-25 ENCOUNTER — Encounter: Payer: Self-pay | Admitting: Cardiology

## 2021-03-25 VITALS — BP 136/84 | HR 81 | Ht 73.0 in | Wt 369.8 lb

## 2021-03-25 DIAGNOSIS — Z79899 Other long term (current) drug therapy: Secondary | ICD-10-CM

## 2021-03-25 DIAGNOSIS — G4733 Obstructive sleep apnea (adult) (pediatric): Secondary | ICD-10-CM

## 2021-03-25 DIAGNOSIS — R0609 Other forms of dyspnea: Secondary | ICD-10-CM

## 2021-03-25 DIAGNOSIS — M7989 Other specified soft tissue disorders: Secondary | ICD-10-CM | POA: Insufficient documentation

## 2021-03-25 HISTORY — DX: Other forms of dyspnea: R06.09

## 2021-03-25 MED ORDER — POTASSIUM CHLORIDE ER 10 MEQ PO TBCR: 10.0000 meq | EXTENDED_RELEASE_TABLET | Freq: Every day | ORAL | 1 refills | Status: DC

## 2021-03-25 MED ORDER — FUROSEMIDE 40 MG PO TABS: 40.0000 mg | ORAL_TABLET | Freq: Every day | ORAL | 1 refills | Status: DC

## 2021-03-25 NOTE — Patient Instructions (Signed)
Medication Instructions:  Your physician has recommended you make the following change in your medication:   START: Lasix 40 mg daily   START: potassium 10 meq daily   *If you need a refill on your cardiac medications before your next appointment, please call your pharmacy*   Lab Work: Your physician recommends that you return for lab work today: bmp, pro  bnp   And in 1 week : bmp   If you have labs (blood work) drawn today and your tests are completely normal, you will receive your results only by: [place] (if you have MyChart) OR A paper copy in the mail If you have any lab test that is abnormal or we need to change your treatment, we will call you to review the results.   Testing/Procedures: Your physician has requested that you have an echocardiogram. Echocardiography is a painless test that uses sound waves to create images of your heart. It provides your doctor with information about the size and shape of your heart and how well your heart's chambers and valves are working. This procedure takes approximately one hour. There are no restrictions for this procedure.    Follow-Up: At [facility], you and your health needs are our priority.  As part of our continuing mission to provide you with exceptional heart care, we have created designated Provider Care Teams.  These Care Teams include your primary Cardiologist (physician) and Advanced Practice Providers (APPs -  Physician Assistants and Nurse Practitioners) who all work together to provide you with the care you need, when you need it.  We recommend signing up for the patient portal called "MyChart".  Sign up information is provided on this After Visit Summary.  MyChart is used to connect with patients for Virtual Visits (Telemedicine).  Patients are able to view lab/test results, encounter notes, upcoming appointments, etc.  Non-urgent messages can be sent to your provider as well.   To learn more about what you can  do with MyChart, go to [email or web address].    Your next appointment:   1 month(s)  The format for your next appointment:   In Person  Provider:   Jenne Campus, MD    Other Instructions  Echocardiogram An echocardiogram is a test that uses sound waves (ultrasound) to produce images of the heart. Images from an echocardiogram can provide important information about: Heart size and shape. The size and thickness and movement of your heart's walls. Heart muscle function and strength. Heart valve function or if you have stenosis. Stenosis is when the heart valves are too narrow. If blood is flowing backward through the heart valves (regurgitation). A tumor or infectious growth around the heart valves. Areas of heart muscle that are not working well because of poor blood flow or injury from a heart attack. Aneurysm detection. An aneurysm is a weak or damaged part of an artery wall. The wall bulges out from the normal force of blood pumping through the body. Tell a health care provider about: Any allergies you have. All medicines you are taking, including vitamins, herbs, eye drops, creams, and over-the-counter medicines. Any blood disorders you have. Any surgeries you have had. Any medical conditions you have. Whether you are pregnant or may be pregnant. What are the risks? Generally, this is a safe test. However, problems may occur, including an allergic reaction to dye (contrast) that may be used during the test. What happens before the test? No specific preparation is needed. You may eat and drink normally. What  happens during the test?  You will take off your clothes from the waist up and put on a hospital gown. Electrodes or electrocardiogram (ECG)patches may be placed on your chest. The electrodes or patches are then connected to a device that monitors your heart rate and rhythm. You will lie down on a table for an ultrasound exam. A gel will be applied to your chest  to help sound waves pass through your skin. A handheld device, called a transducer, will be pressed against your chest and moved over your heart. The transducer produces sound waves that travel to your heart and bounce back (or "echo" back) to the transducer. These sound waves will be captured in real-time and changed into images of your heart that can be viewed on a video monitor. The images will be recorded on a computer and reviewed by your health care provider. You may be asked to change positions or hold your breath for a short time. This makes it easier to get different views or better views of your heart. In some cases, you may receive contrast through an IV in one of your veins. This can improve the quality of the pictures from your heart. The procedure may vary among health care providers and hospitals. What can I expect after the test? You may return to your normal, everyday life, including diet, activities, and medicines, unless your health care provider tells you not to do that. Follow these instructions at home: It is up to you to get the results of your test. Ask your health care provider, or the department that is doing the test, when your results will be ready. Keep all follow-up visits. This is important. Summary An echocardiogram is a test that uses sound waves (ultrasound) to produce images of the heart. Images from an echocardiogram can provide important information about the size and shape of your heart, heart muscle function, heart valve function, and other possible heart problems. You do not need to do anything to prepare before this test. You may eat and drink normally. After the echocardiogram is completed, you may return to your normal, everyday life, unless your health care provider tells you not to do that. This information is not intended to replace advice given to you by your health care provider. Make sure you discuss any questions you have with your health care  provider. Document Revised: 01/05/2021 Document Reviewed: 12/16/2019 Elsevier Patient Education  [place].  Furosemide Tablets What is this medication? FUROSEMIDE (fyoor [place] se mide) treats high blood pressure. It may also be used to reduce swelling related to heart, kidney, or liver disease. It helps your kidneys remove more fluid and salt from your blood through the urine. It belongs to a group of medications called diuretics. This medicine may be used for other purposes; ask your health care provider or pharmacist if you have questions. COMMON BRAND NAME(S): Active-Medicated Specimen Kit, Delone, Diuscreen, Lasix, RX Specimen Collection Kit, Specimen Collection Kit, URINX Medicated Specimen Collection What should I tell my care team before I take this medication? They need to know if you have any of these conditions: Abnormal blood electrolytes Diarrhea or vomiting Gout Heart disease Kidney disease, small amounts of urine, or difficulty passing urine Liver disease Thyroid disease An unusual or allergic reaction to furosemide, sulfa medications, other medications, foods, dyes, or preservatives Pregnant or trying to get pregnant Breast-feeding How should I use this medication? Take this medication by mouth. Take it as directed on the prescription label at  the same time every day. You can take it with or without food. If it upsets your stomach, take it with food. Keep taking it unless your care team tells you to stop. Talk to your care team about the use of this medication in children. Special care may be needed. Overdosage: If you think you have taken too much of this medicine contact a poison control center or emergency room at once. NOTE: This medicine is only for you. Do not share this medicine with others. What if I miss a dose? If you miss a dose, take it as soon as you can. If it is almost time for your next dose, take only that dose. Do not take double or extra  doses. What may interact with this medication? Aspirin and aspirin-like medications Certain antibiotics Chloral hydrate Cisplatin Cyclosporine Digoxin Diuretics Laxatives Lithium Medications for blood pressure Medications that relax muscles for surgery Methotrexate NSAIDs, medications for pain and inflammation like ibuprofen, naproxen, or indomethacin Phenytoin Steroid medications like prednisone or cortisone Sucralfate Thyroid hormones This list may not describe all possible interactions. Give your health care provider a list of all the medicines, herbs, non-prescription drugs, or dietary supplements you use. Also tell them if you smoke, drink alcohol, or use illegal drugs. Some items may interact with your medicine. What should I watch for while using this medication? Visit your care team for regular checks on your progress. Check your blood pressure regularly. Ask your care team what your blood pressure should be, and when you should contact him or her. If you are a diabetic, check your blood sugar as directed. This medication may cause serious skin reactions. They can happen weeks to months after starting the medication. Contact your care team right away if you notice fevers or flu-like symptoms with a rash. The rash may be red or purple and then turn into blisters or peeling of the skin. Or, you might notice a red rash with swelling of the face, lips or lymph nodes in your neck or under your arms. You may need to be on a special diet while taking this medication. Check with your care team. Also, ask how many glasses of fluid you need to drink a day. You must not get dehydrated. You may get drowsy or dizzy. Do not drive, use machinery, or do anything that needs mental alertness until you know how this medication affects you. Do not stand or sit up quickly, especially if you are an older patient. This reduces the risk of dizzy or fainting spells. Alcohol can make you more drowsy and dizzy.  Avoid alcoholic drinks. This medication can make you more sensitive to the sun. Keep out of the sun. If you cannot avoid being in the sun, wear protective clothing and use sunscreen. Do not use sun lamps or tanning beds/booths. What side effects may I notice from receiving this medication? Side effects that you should report to your care team as soon as possible: Allergic reactions--skin rash, itching, hives, swelling of the face, lips, tongue, or throat Dehydration--increased thirst, dry mouth, feeling faint or lightheaded, headache, dark yellow or brown urine Hearing loss, ringing in ears High blood sugar (hyperglycemia)--increased thirst or amount of urine, unusual weakness or fatigue, blurry vision Low blood pressure--dizziness, feeling faint or lightheaded, blurry vision Low potassium level--muscle pain or cramps, unusual weakness or fatigue, fast or irregular heartbeat, constipation Side effects that usually do not require medical attention (report to your care team if they continue or are bothersome): Burning  or tingling sensation in hands or feet Constipation Diarrhea Dizziness Headache This list may not describe all possible side effects. Call your doctor for medical advice about side effects. You may report side effects to FDA at 1-800-FDA-1088. Where should I keep my medication? Keep out of the reach of children and pets. Store at room temperature between 20 and 25 degrees C (68 and 77 degrees F). Protect from light and moisture. Keep the container tightly closed. Throw away any unused medication after the expiration date. NOTE: This sheet is a summary. It may not cover all possible information. If you have questions about this medicine, talk to your doctor, pharmacist, or health care provider.  2022 Elsevier/Gold Standard (2021-01-11 00:00:00)

## 2021-03-25 NOTE — Progress Notes (Signed)
Cardiology Office Note:    Date:  03/25/2021   ID:  Jerome Phelps, DOB 12/19/1960, MRN [id number]  PCP:  Noni Saupe, MD  Cardiologist:  Gypsy Balsam, MD    Referring MD: Noni Saupe, MD   Chief Complaint  Patient presents with   Shortness of Breath         Leg Swelling    Bilateral     History of Present Illness:    Jerome Phelps is a 60 y.o. male with past medical history significant for obstructive sleep apnea, coronary artery disease with cardiac catheterization 2014 showing 20% LAD lesion however stress test done last year showing no evidence of ischemia.  He comes today because he noticed swelling of lower extremities especially at evening time.  He also noticed worsening of shortness of breath with exertion.  He denies have any paroxysmal nocturnal dyspnea.  There is no chest pain tightness squeezing pressure burning chest.  Past Medical History:  Diagnosis Date   Baker's cyst    Carotid artery occlusion    Hyperlipidemia    Sleep apnea    Venous insufficiency     Past Surgical History:  Procedure Laterality Date   CORONARY ANGIOPLASTY     Right/Left   KNEE SURGERY      Current Medications: Current Meds  Medication Sig   furosemide (LASIX) 40 MG tablet Take 1 tablet (40 mg total) by mouth daily.   meloxicam (MOBIC) 15 MG tablet Take 15 mg by mouth daily.   Multiple Vitamin (MULTI-VITAMINS) TABS Take 1 tablet by mouth daily. Unknown strenght   potassium chloride (KLOR-CON) 10 MEQ tablet Take 1 tablet (10 mEq total) by mouth daily.   Current Facility-Administered Medications for the 03/25/21 encounter (Office Visit) with Georgeanna Lea, MD  Medication   triamcinolone acetonide (KENALOG) 10 MG/ML injection 10 mg     Allergies:   Patient has no known allergies.   Social History   Socioeconomic History   Marital status: Married    Spouse name: Not on file   Number of children: Not on file   Years of education: Not on file    Highest education level: Not on file  Occupational History   Not on file  Tobacco Use   Smoking status: Never   Smokeless tobacco: Never  Vaping Use   Vaping Use: Never used  Substance and Sexual Activity   Alcohol use: No   Drug use: No   Sexual activity: Not on file  Other Topics Concern   Not on file  Social History Narrative   Not on file   Social Determinants of Health   Financial Resource Strain: Not on file  Food Insecurity: Not on file  Transportation Needs: Not on file  Physical Activity: Not on file  Stress: Not on file  Social Connections: Not on file     Family History: The patient's family history includes Lung cancer in his father; Thyroid cancer in his mother. ROS:   Please see the history of present illness.    All 14 point review of systems negative except as described per history of present illness  EKGs/Labs/Other Studies Reviewed:      Recent Labs: No results found for requested labs within last 8760 hours.  Recent Lipid Panel No results found for: CHOL, TRIG, HDL, CHOLHDL, VLDL, LDLCALC, LDLDIRECT  Physical Exam:    VS:  BP 136/84 (BP Location: Left Arm, Patient Position: Sitting)   Pulse 81   Ht 6'  1" (1.854 m)   Wt (!) 369 lb 12.8 oz (167.7 kg)   SpO2 96%   BMI 48.79 kg/m     Wt Readings from Last 3 Encounters:  03/25/21 (!) 369 lb 12.8 oz (167.7 kg)  06/24/19 (!) 312 lb (141.5 kg)  06/12/19 (!) 312 lb 12.8 oz (141.9 kg)     GEN:  Well nourished, well developed in no acute distress HEENT: Normal NECK: No JVD; No carotid bruits LYMPHATICS: No lymphadenopathy CARDIAC: RRR, no murmurs, no rubs, no gallops RESPIRATORY:  Clear to auscultation without rales, wheezing or rhonchi  ABDOMEN: Soft, non-tender, non-distended MUSCULOSKELETAL:  No edema; No deformity  SKIN: Warm and dry LOWER EXTREMITIES: no swelling NEUROLOGIC:  Alert and oriented x 3 PSYCHIATRIC:  Normal affect   ASSESSMENT:    1. Dyspnea on exertion   2. Medication  management   3. Swelling of both lower extremities   4. Obstructive sleep apnea syndrome    PLAN:    In order of problems listed above:  He does have signs and symptoms of decompensated congestive heart failure.  I will ask him to have Chem-7 done as well as proBNP today.  I will start him with furosemide 40 mg with 10 mg of potassium we will check Chem-7 next week to make sure his kidney function as well as potassium is still within acceptable range.  He be scheduled to have an echocardiogram to assess left ventricle ejection fraction. Dyspnea on exertion again suspicion for congestive heart failure.  Obviously since he is morbidly obese that could be also contributing to his symptomatology. Obstructive sleep apnea.  He does use CPAP mask on the regular basis.  Recently his equipment broke he had another sleep study done finally he got equipment back and he is using on the Velcro basis.   Medication Adjustments/Labs and Tests Ordered: Current medicines are reviewed at length with the patient today.  Concerns regarding medicines are outlined above.  Orders Placed This Encounter  Procedures   Basic metabolic panel   Pro b natriuretic peptide (BNP)   EKG 12-Lead   ECHOCARDIOGRAM COMPLETE   Medication changes:  Meds ordered this encounter  Medications   furosemide (LASIX) 40 MG tablet    Sig: Take 1 tablet (40 mg total) by mouth daily.    Dispense:  90 tablet    Refill:  1   potassium chloride (KLOR-CON) 10 MEQ tablet    Sig: Take 1 tablet (10 mEq total) by mouth daily.    Dispense:  90 tablet    Refill:  1    Signed, Georgeanna Lea, MD, [place] 03/25/2021 4:44 PM    [facility]

## 2021-03-26 LAB — BASIC METABOLIC PANEL
BUN/Creatinine Ratio: 16 (ref 10–24)
BUN: 19 mg/dL (ref 8–27)
CO2: 24 mmol/L (ref 20–29)
Calcium: 9.1 mg/dL (ref 8.6–10.2)
Chloride: 108 mmol/L — ABNORMAL HIGH (ref 96–106)
Creatinine, Ser: 1.16 mg/dL (ref 0.76–1.27)
Glucose: 104 mg/dL — ABNORMAL HIGH (ref 70–99)
Potassium: 4.9 mmol/L (ref 3.5–5.2)
Sodium: 143 mmol/L (ref 134–144)
eGFR: 71 mL/min/{1.73_m2} (ref >59)

## 2021-03-26 LAB — PRO B NATRIURETIC PEPTIDE: NT-Pro BNP: 84 pg/mL (ref 0–210)

## 2021-03-28 ENCOUNTER — Telehealth: Payer: Self-pay

## 2021-03-28 NOTE — Telephone Encounter (Signed)
-----   Message from Georgeanna Lea, MD sent at 03/28/2021 10:10 AM EST ----- Labs are looking good, continue present management

## 2021-03-28 NOTE — Telephone Encounter (Signed)
Patient notified of results.

## 2021-03-30 NOTE — Addendum Note (Signed)
Addended by: Hazle Quant on: 03/30/2021 01:28 PM   Modules accepted: Orders

## 2021-03-31 LAB — BASIC METABOLIC PANEL
BUN/Creatinine Ratio: 13 (ref 10–24)
BUN: 15 mg/dL (ref 8–27)
CO2: 23 mmol/L (ref 20–29)
Calcium: 8.8 mg/dL (ref 8.6–10.2)
Chloride: 105 mmol/L (ref 96–106)
Creatinine, Ser: 1.17 mg/dL (ref 0.76–1.27)
Glucose: 86 mg/dL (ref 70–99)
Potassium: 4.6 mmol/L (ref 3.5–5.2)
Sodium: 142 mmol/L (ref 134–144)
eGFR: 70 mL/min/{1.73_m2} (ref >59)

## 2021-04-15 ENCOUNTER — Ambulatory Visit (INDEPENDENT_AMBULATORY_CARE_PROVIDER_SITE_OTHER): Payer: 59

## 2021-04-15 ENCOUNTER — Other Ambulatory Visit: Payer: Self-pay

## 2021-04-15 DIAGNOSIS — Z79899 Other long term (current) drug therapy: Secondary | ICD-10-CM | POA: Diagnosis not present

## 2021-04-15 DIAGNOSIS — R0609 Other forms of dyspnea: Secondary | ICD-10-CM

## 2021-04-17 LAB — ECHOCARDIOGRAM COMPLETE
Area-P 1/2: 3.48 cm2
S' Lateral: 3.1 cm

## 2021-04-19 DIAGNOSIS — M7120 Synovial cyst of popliteal space [Baker], unspecified knee: Secondary | ICD-10-CM | POA: Insufficient documentation

## 2021-04-19 HISTORY — DX: Synovial cyst of popliteal space (Baker), unspecified knee: M71.20

## 2021-04-22 ENCOUNTER — Other Ambulatory Visit: Payer: Self-pay

## 2021-04-22 ENCOUNTER — Ambulatory Visit (INDEPENDENT_AMBULATORY_CARE_PROVIDER_SITE_OTHER): Payer: 59 | Admitting: Cardiology

## 2021-04-22 ENCOUNTER — Encounter: Payer: Self-pay | Admitting: Cardiology

## 2021-04-22 VITALS — BP 134/78 | HR 78 | Ht 73.0 in | Wt 367.2 lb

## 2021-04-22 DIAGNOSIS — R0609 Other forms of dyspnea: Secondary | ICD-10-CM | POA: Diagnosis not present

## 2021-04-22 DIAGNOSIS — E785 Hyperlipidemia, unspecified: Secondary | ICD-10-CM

## 2021-04-22 DIAGNOSIS — I2510 Atherosclerotic heart disease of native coronary artery without angina pectoris: Secondary | ICD-10-CM | POA: Diagnosis not present

## 2021-04-22 NOTE — Progress Notes (Signed)
Cardiology Office Note:    Date:  04/22/2021   ID:  Jerome Phelps, DOB 07/11/1960, MRN [id number]  PCP:  Rick Duff, PA-C  Cardiologist:  Gypsy Balsam, MD    Referring MD: Noni Saupe, MD   Chief Complaint  Patient presents with   Shortness of Breath    Swelling in his leg derease    History of Present Illness:    Jerome Phelps is a 60 y.o. male   with past medical history significant for obstructive sleep apnea, coronary artery disease with cardiac catheterization 2014 showing 20% LAD lesion however stress test done last year showing no evidence of ischemia. I seen him about a month ago with chief complaint of progressive worsening shortness of breath.  Almost any effort will get him short of breath.  There was no chest pain tightness squeezing pressure burning chest.  At that time working diagnosis of congestive heart failure has been made he was given diuretics I did check his proBNP which was normal, echocardiogram was done which showed low normal ejection fraction but overall nothing striking that could explain his shortness of breath.  He is coming today to my office for follow-up.  Overall he said he is doing better his legs are much improved but shortness of breath is still there we had a long discussion about what to do with the situation and actually recommended referral to pulmonary before reinvestigating coronary artery disease.  He is reluctant to do it he reach conclusion himself that may be the problem is secondary to his weight which dramatically increased within the last few months.  Apparently he got knee surgery he could not walk he was just sitting and eating that make him to gain significant amount of weight about 30 to 40 pounds which probably is responsible for his worsening of shortness of breath.  Past Medical History:  Diagnosis Date   Baker's cyst    Carotid artery occlusion    Hyperlipidemia    Sleep apnea    Venous insufficiency     Past  Surgical History:  Procedure Laterality Date   CORONARY ANGIOPLASTY     Right/Left   KNEE SURGERY      Current Medications: Current Meds  Medication Sig   furosemide (LASIX) 40 MG tablet Take 1 tablet (40 mg total) by mouth daily.   meloxicam (MOBIC) 15 MG tablet Take 15 mg by mouth daily.   Multiple Vitamin (MULTI-VITAMINS) TABS Take 1 tablet by mouth daily. Unknown strenght   potassium chloride (KLOR-CON) 10 MEQ tablet Take 1 tablet (10 mEq total) by mouth daily.   Current Facility-Administered Medications for the 04/22/21 encounter (Office Visit) with Georgeanna Lea, MD  Medication   triamcinolone acetonide (KENALOG) 10 MG/ML injection 10 mg     Allergies:   Patient has no known allergies.   Social History   Socioeconomic History   Marital status: Married    Spouse name: Not on file   Number of children: Not on file   Years of education: Not on file   Highest education level: Not on file  Occupational History   Not on file  Tobacco Use   Smoking status: Never   Smokeless tobacco: Never  Vaping Use   Vaping Use: Never used  Substance and Sexual Activity   Alcohol use: No   Drug use: No   Sexual activity: Not on file  Other Topics Concern   Not on file  Social History Narrative   Not on  file   Social Determinants of Health   Financial Resource Strain: Not on file  Food Insecurity: Not on file  Transportation Needs: Not on file  Physical Activity: Not on file  Stress: Not on file  Social Connections: Not on file     Family History: The patient's family history includes Lung cancer in his father; Thyroid cancer in his mother. ROS:   Please see the history of present illness.    All 14 point review of systems negative except as described per history of present illness  EKGs/Labs/Other Studies Reviewed:      Recent Labs: 03/25/2021: NT-Pro BNP 84 03/30/2021: BUN 15; Creatinine, Ser 1.17; Potassium 4.6; Sodium 142  Recent Lipid Panel No results  found for: CHOL, TRIG, HDL, CHOLHDL, VLDL, LDLCALC, LDLDIRECT  Physical Exam:    VS:  BP 134/78 (BP Location: Right Arm, Patient Position: Sitting)    Pulse 78    Ht 6\' 1"  (1.854 m)    Wt (!) 367 lb 3.2 oz (166.6 kg)    SpO2 94%    BMI 48.45 kg/m     Wt Readings from Last 3 Encounters:  04/22/21 (!) 367 lb 3.2 oz (166.6 kg)  03/25/21 (!) 369 lb 12.8 oz (167.7 kg)  06/24/19 (!) 312 lb (141.5 kg)     GEN:  Well nourished, well developed in no acute distress HEENT: Normal NECK: No JVD; No carotid bruits LYMPHATICS: No lymphadenopathy CARDIAC: RRR, no murmurs, no rubs, no gallops RESPIRATORY:  Clear to auscultation without rales, wheezing or rhonchi  ABDOMEN: Soft, non-tender, non-distended MUSCULOSKELETAL:  No edema; No deformity  SKIN: Warm and dry LOWER EXTREMITIES: no swelling NEUROLOGIC:  Alert and oriented x 3 PSYCHIATRIC:  Normal affect   ASSESSMENT:    1. Coronary artery disease involving native coronary artery of native heart without angina pectoris   2. Morbid obesity (HCC)   3. Dyslipidemia   4. Dyspnea on exertion    PLAN:    In order of problems listed above:  [place] exertion: I offered him referral to pulmonary he wants to wait he would like to lose some weight and see if that helps with his symptomatology I think that is an excellent plan. Coronary artery disease stress test last year negative, cardiac cath in 2014 20% LAD lesion.  Case risk factors modifications.  He is on aspirin which I will continue, he is not on statin I will make arrangements for him to have fasting lipid profile be done and then make a decision about treatment. Dyspnea on exertion could be related to his weight he is trying to work on losing it and see how things will go I see him back in my office in about 3 to 4 months and then will decide about what to do.   Medication Adjustments/Labs and Tests Ordered: Current medicines are reviewed at length with the patient today.  Concerns  regarding medicines are outlined above.  No orders of the defined types were placed in this encounter.  Medication changes: No orders of the defined types were placed in this encounter.   Signed, 2015, MD, [facility] 04/22/2021 3:37 PM    [facility]

## 2021-04-22 NOTE — Patient Instructions (Signed)
Medication Instructions:  Your physician recommends that you continue on your current medications as directed. Please refer to the Current Medication list given to you today.  *If you need a refill on your cardiac medications before your next appointment, please call your pharmacy*   Lab Work: Your physician recommends that you return for lab work today: bmp, lipid  If you have labs (blood work) drawn today and your tests are completely normal, you will receive your results only by: . MyChart Message (if you have MyChart) OR . A paper copy in the mail If you have any lab test that is abnormal or we need to change your treatment, we will call you to review the results.   Testing/Procedures: None   Follow-Up: At [facility], you and your health needs are our priority.  As part of our continuing mission to provide you with exceptional heart care, we have created designated Provider Care Teams.  These Care Teams include your primary Cardiologist (physician) and Advanced Practice Providers (APPs -  Physician Assistants and Nurse Practitioners) who all work together to provide you with the care you need, when you need it.  We recommend signing up for the patient portal called "MyChart".  Sign up information is provided on this After Visit Summary.  MyChart is used to connect with patients for Virtual Visits (Telemedicine).  Patients are able to view lab/test results, encounter notes, upcoming appointments, etc.  Non-urgent messages can be sent to your provider as well.   To learn more about what you can do with MyChart, go to [email or web address].    Your next appointment:   4 month(s)  The format for your next appointment:   In Person  Provider:   Robert Krasowski, MD   Other Instructions   

## 2021-04-26 LAB — BASIC METABOLIC PANEL
BUN/Creatinine Ratio: 15 (ref 10–24)
BUN: 17 mg/dL (ref 8–27)
CO2: 26 mmol/L (ref 20–29)
Calcium: 8.7 mg/dL (ref 8.6–10.2)
Chloride: 104 mmol/L (ref 96–106)
Creatinine, Ser: 1.12 mg/dL (ref 0.76–1.27)
Glucose: 94 mg/dL (ref 70–99)
Potassium: 4.5 mmol/L (ref 3.5–5.2)
Sodium: 143 mmol/L (ref 134–144)
eGFR: 74 mL/min/{1.73_m2} (ref >59)

## 2021-04-26 LAB — LIPID PANEL
Chol/HDL Ratio: 4.5 ratio (ref 0.0–5.0)
Cholesterol, Total: 181 mg/dL (ref 100–199)
HDL: 40 mg/dL (ref >39)
LDL Chol Calc (NIH): 113 mg/dL — ABNORMAL HIGH (ref 0–99)
Triglycerides: 156 mg/dL — ABNORMAL HIGH (ref 0–149)
VLDL Cholesterol Cal: 28 mg/dL (ref 5–40)

## 2021-04-29 ENCOUNTER — Telehealth: Payer: Self-pay

## 2021-04-29 DIAGNOSIS — I2510 Atherosclerotic heart disease of native coronary artery without angina pectoris: Secondary | ICD-10-CM

## 2021-04-29 MED ORDER — ATORVASTATIN CALCIUM 10 MG PO TABS: 10.0000 mg | ORAL_TABLET | Freq: Every day | ORAL | 3 refills | Status: DC

## 2021-04-29 NOTE — Telephone Encounter (Signed)
Spoke with patient regarding results and recommendation.  Patient verbalizes understanding and is agreeable to plan of care. Advised patient to call back with any issues or concerns.  

## 2021-04-29 NOTE — Telephone Encounter (Signed)
-----   Message from Georgeanna Lea, MD sent at 04/28/2021  9:24 AM EST ----- Cholesterol unacceptably high.  I recommend to start Lipitor 10 mg daily, fasting lipid profile, AST ALT 6 weeks

## 2021-08-22 ENCOUNTER — Ambulatory Visit: Payer: 59 | Admitting: Cardiology

## 2021-08-22 ENCOUNTER — Encounter: Payer: Self-pay | Admitting: Cardiology

## 2021-08-22 VITALS — BP 140/92 | HR 80 | Ht 73.0 in | Wt 363.2 lb

## 2021-08-22 DIAGNOSIS — I10 Essential (primary) hypertension: Secondary | ICD-10-CM

## 2021-08-22 DIAGNOSIS — I2510 Atherosclerotic heart disease of native coronary artery without angina pectoris: Secondary | ICD-10-CM | POA: Diagnosis not present

## 2021-08-22 DIAGNOSIS — I6529 Occlusion and stenosis of unspecified carotid artery: Secondary | ICD-10-CM | POA: Diagnosis not present

## 2021-08-22 DIAGNOSIS — G4733 Obstructive sleep apnea (adult) (pediatric): Secondary | ICD-10-CM | POA: Diagnosis not present

## 2021-08-22 DIAGNOSIS — R0609 Other forms of dyspnea: Secondary | ICD-10-CM

## 2021-08-22 DIAGNOSIS — E785 Hyperlipidemia, unspecified: Secondary | ICD-10-CM

## 2021-08-22 HISTORY — DX: Essential (primary) hypertension: I10

## 2021-08-22 MED ORDER — PRAVASTATIN SODIUM 40 MG PO TABS: 40.0000 mg | ORAL_TABLET | Freq: Every evening | ORAL | 0 refills | Status: DC

## 2021-08-22 NOTE — Patient Instructions (Signed)
Medication Instructions:  ?Your physician has recommended you make the following change in your medication: ? ? START: PRAVASTATIN 40mg  1 daily by mouth  ? ?*If you need a refill on your cardiac medications before your next appointment, please call your pharmacy* ? ? ?Lab Work: ?Your physician recommends that you have a BMP today then return in 6 weeks for Fasting Lipids, AST, ALT  ?If you have labs (blood work) drawn today and your tests are completely normal, you will receive your results only by: ?MyChart Message (if you have MyChart) OR ?A paper copy in the mail ?If you have any lab test that is abnormal or we need to change your treatment, we will call you to review the results. ? ? ?Testing/Procedures: ?Your physician has requested that you have a carotid duplex.  ? ?This test is an ultrasound of the carotid arteries in your neck. It looks at blood flow through these arteries that supply the brain with blood. Allow one hour for this exam. There are no restrictions or special instructions.  ? ? ?Follow-Up: ?At [facility], you and your health needs are our priority.  As part of our continuing mission to provide you with exceptional heart care, we have created designated Provider Care Teams.  These Care Teams include your primary Cardiologist (physician) and Advanced Practice Providers (APPs -  Physician Assistants and Nurse Practitioners) who all work together to provide you with the care you need, when you need it. ? ?We recommend signing up for the patient portal called "MyChart".  Sign up information is provided on this After Visit Summary.  MyChart is used to connect with patients for Virtual Visits (Telemedicine).  Patients are able to view lab/test results, encounter notes, upcoming appointments, etc.  Non-urgent messages can be sent to your provider as well.   ?To learn more about what you can do with MyChart, go to [facility].   ? ?Your next appointment:   ?6 month(s) ? ?The format for  your next appointment:   ?In Person ? ?Provider:   ?[email or web address], MD  ? ? ?Other Instructions ?None ? ?Important Information About Sugar ? ? ? ? ?  ?

## 2021-08-22 NOTE — Progress Notes (Signed)
?Cardiology Office Note:   ? ?Date:  08/22/2021  ? ?ID:  Jerome Phelps, DOB July 24, 1960, MRN [id number] ? ?PCP:  Jerome Duff, Jerome Phelps  ?Cardiologist:  Jerome Balsam, MD   ? ?Referring MD: Jerome Duff, Jerome Phelps  ? ?Chief Complaint  ?Patient presents with  ? Follow-up  ?Doing fine shortness of breath is better since he lost some weight ? ?History of Present Illness:   ? ?Jerome Phelps is a 61 y.o. male past medical history significant for obstructive sleep apnea, coronary artery disease cardiac catheterization 2014 showing 20% LAD stenosis stress test done 2 years ago showing no evidence of ischemia.  He also got morbid obesity, mild carotic arterial disease.  Comes today 2 months for follow-up still shortness of breath the problem.  He lost few pounds feeling better but still shortness of breath is an issue.  Denies have any chest pain tightness squeezing pressure burning chest no palpitations dizziness swelling of lower extremities ? ?Past Medical History:  ?Diagnosis Date  ? Baker's cyst   ? Carotid artery occlusion   ? Hyperlipidemia   ? Sleep apnea   ? Venous insufficiency   ? ? ?Past Surgical History:  ?Procedure Laterality Date  ? CORONARY ANGIOPLASTY    ? Right/Left  ? KNEE SURGERY    ? ? ?Current Medications: ?Current Meds  ?Medication Sig  ? furosemide (LASIX) 40 MG tablet Take 1 tablet (40 mg total) by mouth daily.  ? meloxicam (MOBIC) 15 MG tablet Take 15 mg by mouth daily.  ? Multiple Vitamin (MULTI-VITAMINS) TABS Take 1 tablet by mouth daily. Unknown strenght  ? potassium chloride (KLOR-CON) 10 MEQ tablet Take 1 tablet (10 mEq total) by mouth daily.  ? [DISCONTINUED] atorvastatin (LIPITOR) 10 MG tablet Take 1 tablet (10 mg total) by mouth daily.  ? ?Current Facility-Administered Medications for the 08/22/21 encounter (Office Visit) with Georgeanna Lea, MD  ?Medication  ? triamcinolone acetonide (KENALOG) 10 MG/ML injection 10 mg  ?  ? ?Allergies:   Atorvastatin  ? ?Social History   ? ?Socioeconomic History  ? Marital status: Married  ?  Spouse name: Not on file  ? Number of children: Not on file  ? Years of education: Not on file  ? Highest education level: Not on file  ?Occupational History  ? Not on file  ?Tobacco Use  ? Smoking status: Never  ? Smokeless tobacco: Never  ?Vaping Use  ? Vaping Use: Never used  ?Substance and Sexual Activity  ? Alcohol use: No  ? Drug use: No  ? Sexual activity: Not on file  ?Other Topics Concern  ? Not on file  ?Social History Narrative  ? Not on file  ? ?Social Determinants of Health  ? ?Financial Resource Strain: Not on file  ?Food Insecurity: Not on file  ?Transportation Needs: Not on file  ?Physical Activity: Not on file  ?Stress: Not on file  ?Social Connections: Not on file  ?  ? ?Family History: ?The patient's family history includes Lung cancer in his father; Thyroid cancer in his mother. ?ROS:   ?Please see the history of present illness.    ?All 14 point review of systems negative except as described per history of present illness ? ?EKGs/Labs/Other Studies Reviewed:   ? ? ? ?Recent Labs: ?03/25/2021: NT-Pro BNP 84 ?04/25/2021: BUN 17; Creatinine, Ser 1.12; Potassium 4.5; Sodium 143  ?Recent Lipid Panel ?   ?Component Value Date/Time  ? CHOL 181 04/25/2021 0912  ? TRIG 156 (H) 04/25/2021  0912  ? HDL 40 04/25/2021 0912  ? CHOLHDL 4.5 04/25/2021 0912  ? LDLCALC 113 (H) 04/25/2021 0912  ? ? ?Physical Exam:   ? ?VS:  BP (!) 140/92 (BP Location: Left Arm, Patient Position: Sitting)   Pulse 80   Ht 6\' 1"  (1.854 m)   Wt (!) 363 lb 3.2 oz (164.7 kg)   SpO2 93%   BMI 47.92 kg/m?    ? ?Wt Readings from Last 3 Encounters:  ?08/22/21 (!) 363 lb 3.2 oz (164.7 kg)  ?04/22/21 (!) 367 lb 3.2 oz (166.6 kg)  ?03/25/21 (!) 369 lb 12.8 oz (167.7 kg)  ?  ? ?GEN:  Well nourished, well developed in no acute distress ?HEENT: Normal ?NECK: No JVD; No carotid bruits ?LYMPHATICS: No lymphadenopathy ?CARDIAC: RRR, no murmurs, no rubs, no gallops ?RESPIRATORY:  Clear to  auscultation without rales, wheezing or rhonchi  ?ABDOMEN: Soft, non-tender, non-distended ?MUSCULOSKELETAL:  No edema; No deformity  ?SKIN: Warm and dry ?LOWER EXTREMITIES: no swelling ?NEUROLOGIC:  Alert and oriented x 3 ?PSYCHIATRIC:  Normal affect  ? ?ASSESSMENT:   ? ?1. Dyspnea on exertion   ?2. Coronary artery disease involving native coronary artery of native heart without angina pectoris   ?3. Occlusion of carotid artery, unspecified laterality   ?4. Obstructive sleep apnea syndrome   ?5. Dyslipidemia   ?6. Essential hypertension   ? ?PLAN:   ? ?In order of problems listed above: ? ?Dyspnea on exertion cardiac work-up negative, echocardiogram showing low normal left ventricle ejection fraction, stress test showing no evidence of ischemia.  I offered him last time referred to pulmonary he declined now he agree, therefore will refer him to pulmonary. ?Essential hypertension.  Look like this is a new diagnosis.  I will check his Chem-7 if Chem-7 is fine I will initiate small dose of losartan ?Carotic arterial disease only mild last check in 2021 we will schedule him to have carotic ultrasound done. ?Dyslipidemia I did review K PN which show LDL of 113 HDL 40.  I gave him Lipitor however he was unable to tolerate it.  We will give him some prescription for pravastatin. ? ? ?Medication Adjustments/Labs and Tests Ordered: ?Current medicines are reviewed at length with the patient today.  Concerns regarding medicines are outlined above.  ?No orders of the defined types were placed in this encounter. ? ?Medication changes: No orders of the defined types were placed in this encounter. ? ? ?Signed, ?2022, MD, [facility] ?08/22/2021 9:01 AM    ?08/24/2021 [facility] ?

## 2021-08-23 LAB — BASIC METABOLIC PANEL
BUN/Creatinine Ratio: 16 (ref 10–24)
BUN: 17 mg/dL (ref 8–27)
CO2: 24 mmol/L (ref 20–29)
Calcium: 8.7 mg/dL (ref 8.6–10.2)
Chloride: 104 mmol/L (ref 96–106)
Creatinine, Ser: 1.08 mg/dL (ref 0.76–1.27)
Glucose: 83 mg/dL (ref 70–99)
Potassium: 4.9 mmol/L (ref 3.5–5.2)
Sodium: 143 mmol/L (ref 134–144)
eGFR: 77 mL/min/{1.73_m2} (ref >59)

## 2021-08-25 ENCOUNTER — Telehealth: Payer: Self-pay

## 2021-08-25 MED ORDER — LOSARTAN POTASSIUM 25 MG PO TABS: 25.0000 mg | ORAL_TABLET | Freq: Every day | ORAL | 0 refills | Status: DC

## 2021-08-25 NOTE — Telephone Encounter (Signed)
-----   Message from Georgeanna Lea, MD sent at 08/25/2021 12:32 PM EDT ----- ?Chem-7 looks good.  Start losartan 25 mg daily ?

## 2021-08-29 ENCOUNTER — Ambulatory Visit (INDEPENDENT_AMBULATORY_CARE_PROVIDER_SITE_OTHER): Payer: 59

## 2021-08-29 DIAGNOSIS — I2510 Atherosclerotic heart disease of native coronary artery without angina pectoris: Secondary | ICD-10-CM

## 2021-08-29 DIAGNOSIS — E785 Hyperlipidemia, unspecified: Secondary | ICD-10-CM | POA: Diagnosis not present

## 2021-08-29 DIAGNOSIS — R0609 Other forms of dyspnea: Secondary | ICD-10-CM | POA: Diagnosis not present

## 2021-08-29 DIAGNOSIS — I10 Essential (primary) hypertension: Secondary | ICD-10-CM

## 2021-08-29 DIAGNOSIS — I6529 Occlusion and stenosis of unspecified carotid artery: Secondary | ICD-10-CM | POA: Diagnosis not present

## 2021-08-31 ENCOUNTER — Encounter: Payer: Self-pay | Admitting: Cardiology

## 2021-09-01 ENCOUNTER — Telehealth: Payer: Self-pay

## 2021-09-01 NOTE — Telephone Encounter (Signed)
Spoke with pt about reason for ASA and Losartan per Dr. Wendy Poet note. Pt verbalized understanding and had not other questions. ?

## 2021-09-14 NOTE — Progress Notes (Signed)
? ?Synopsis: Referred for DOE by Georgeanna Lea, MD ? ?Subjective:  ? ?PATIENT ID: Jerome Cuadros GENDER: male DOB: 1960/07/06, MRN: [id number] ? ?Chief Complaint  ?Patient presents with  ? Pulmonary Consult  ?  Referred by Dr. Bing Matter. Pt c/o DOE x 6 months. He gets winded with walking to the mailbox and back and getting dressed. He has dry cough in the mornings.  ? ?61yM with history of OSA - on CPAP uses every night, nCAD, severe obesity  ? ?2 years ago had left knee replaced and lost a lot of weight. Felt good at the time and after gaining it back he's had trouble. ? ?DOE with activity over the last 6 months. Notices it especially after coming out of shower. Has to take a break after walking to mailbox and back. No orthopnea.  ? ?Dry cough in the morning and not really bothersome.  ? ?Otherwise pertinent review of systems is negative. ? ?Father had black lung. Mother had thyroid cancer.  ? ?He never smoked. He is a Naval architect.  ? ?Past Medical History:  ?Diagnosis Date  ? Baker's cyst   ? Carotid artery occlusion   ? Hyperlipidemia   ? Sleep apnea   ? Venous insufficiency   ?  ? ?Family History  ?Problem Relation Age of Onset  ? Thyroid cancer Mother   ? Lung cancer Father   ?  ? ?Past Surgical History:  ?Procedure Laterality Date  ? CORONARY ANGIOPLASTY    ? Right/Left  ? KNEE SURGERY    ? ? ?Social History  ? ?Socioeconomic History  ? Marital status: Married  ?  Spouse name: Not on file  ? Number of children: Not on file  ? Years of education: Not on file  ? Highest education level: Not on file  ?Occupational History  ? Not on file  ?Tobacco Use  ? Smoking status: Never  ? Smokeless tobacco: Never  ?Vaping Use  ? Vaping Use: Never used  ?Substance and Sexual Activity  ? Alcohol use: No  ? Drug use: No  ? Sexual activity: Not on file  ?Other Topics Concern  ? Not on file  ?Social History Narrative  ? Not on file  ? ?Social Determinants of Health  ? ?Financial Resource Strain: Not on file  ?Food Insecurity:  Not on file  ?Transportation Needs: Not on file  ?Physical Activity: Not on file  ?Stress: Not on file  ?Social Connections: Not on file  ?Intimate Partner Violence: Not on file  ?  ? ?Allergies  ?Allergen Reactions  ? Atorvastatin Other (See Comments)  ?  Muscle aches   ?  ? ?Outpatient Medications Prior to Visit  ?Medication Sig Dispense Refill  ? aspirin EC 325 MG tablet Take 325 mg by mouth daily.    ? Fexofenadine-Pseudoephedrine (ALLEGRA-D 24 HOUR PO) Take 1 tablet by mouth daily as needed.    ? meloxicam (MOBIC) 15 MG tablet Take 15 mg by mouth daily.    ? Multiple Vitamin (MULTI-VITAMINS) TABS Take 1 tablet by mouth daily. Unknown strenght    ? losartan (COZAAR) 25 MG tablet Take 1 tablet (25 mg total) by mouth daily. (Patient not taking: Reported on 09/15/2021) 90 tablet 0  ? furosemide (LASIX) 40 MG tablet Take 1 tablet (40 mg total) by mouth daily. 90 tablet 1  ? potassium chloride (KLOR-CON) 10 MEQ tablet Take 1 tablet (10 mEq total) by mouth daily. 90 tablet 1  ? pravastatin (PRAVACHOL) 40 MG tablet Take  1 tablet (40 mg total) by mouth every evening. 90 tablet 0  ? ?Facility-Administered Medications Prior to Visit  ?Medication Dose Route Frequency Provider Last Rate Last Admin  ? triamcinolone acetonide (KENALOG) 10 MG/ML injection 10 mg  10 mg Other Once Alvan Dame, DPM      ? ? ? ? ? ?Objective:  ? ?Physical Exam: ? ?General appearance: 61 y.o., male, NAD, conversant  ?Eyes: anicteric sclerae; PERRL, tracking appropriately ?HENT: NCAT; MMM ?Neck: Trachea midline; no lymphadenopathy, no JVD ?Lungs: CTAB, no crackles, no wheeze, with normal respiratory effort ?CV: RRR, no murmur  ?Abdomen: Soft, non-tender; non-distended, BS present  ?Extremities: No peripheral edema, warm ?Skin: Normal turgor and texture; no rash ?Psych: Appropriate affect ?Neuro: Alert and oriented to person and place, no focal deficit  ? ? ? ?Vitals:  ? 09/15/21 1532  ?BP: 128/66  ?Pulse: 92  ?Temp: 98.5 ?F (36.9 ?C)  ?TempSrc:  Oral  ?SpO2: 97%  ?Weight: (!) 361 lb (163.7 kg)  ?Height: 6\' 1"  (1.854 m)  ? ?97% on RA ?BMI Readings from Last 3 Encounters:  ?09/15/21 47.63 kg/m?  ?08/22/21 47.92 kg/m?  ?04/22/21 48.45 kg/m?  ? ?Wt Readings from Last 3 Encounters:  ?09/15/21 (!) 361 lb (163.7 kg)  ?08/22/21 (!) 363 lb 3.2 oz (164.7 kg)  ?04/22/21 (!) 367 lb 3.2 oz (166.6 kg)  ? ? ? ?CBC ?No results found for: WBC, RBC, HGB, HCT, PLT, MCV, MCH, MCHC, RDW, LYMPHSABS, MONOABS, EOSABS, BASOSABS ? ? ? ?Chest Imaging: ?CXR reviewed by me today with coarsened interstitium awaiting final read ? ?Pulmonary Functions Testing Results: ?   ? View : No data to display.  ?  ?  ?  ? ? ? ? ?Echocardiogram:  ? ?TTE 04/15/21: ? 1. Left ventricular ejection fraction, by estimation, is 50 to 55%. The  ?left ventricle has low normal function. Left ventricular endocardial  ?border not optimally defined to evaluate regional wall motion. There is  ?moderate concentric left ventricular  ?hypertrophy. Left ventricular diastolic parameters are consistent with  ?Grade I diastolic dysfunction (impaired relaxation).  ? 2. Right ventricular systolic function is normal. The right ventricular  ?size is normal. Tricuspid regurgitation signal is inadequate for assessing  ?PA pressure.  ? 3. The mitral valve is normal in structure. No evidence of mitral valve  ?regurgitation. No evidence of mitral stenosis.  ? 4. The aortic valve is tricuspid. Aortic valve regurgitation is not  ?visualized. No aortic stenosis is present.  ? 5. There is moderate dilatation of the ascending aorta, measuring 40 mm.  ? ?Stress test 06/2019: ?Low risk, normal ? ? ?   ?Assessment & Plan:  ? ?# DOE ?Unclear etiology - he wonders if it's primarily due to weight and deconditioning. Alternatively consider undertreated OSA (has had issues with new device), diastolic dysfunction but looks well compensated, angina but had normal stress 06/2019. Little by history to suggest primary pulmonary airways or  parenchymal issue other than a reported history of allergy.  ? ?Plan: ?- BNP, CBC, TSH ?- PFTs next visit ?- to bring copy of CPAP download to next visit ?- trial albuterol prn ? ? ?RTC 6 weeks with PFT ? ?07/2019, MD ?[facility] ?09/15/2021 3:56 PM  ? ?

## 2021-09-15 ENCOUNTER — Ambulatory Visit: Payer: 59 | Admitting: Student

## 2021-09-15 ENCOUNTER — Ambulatory Visit (INDEPENDENT_AMBULATORY_CARE_PROVIDER_SITE_OTHER): Payer: 59

## 2021-09-15 ENCOUNTER — Encounter: Payer: Self-pay | Admitting: Student

## 2021-09-15 VITALS — BP 128/66 | HR 92 | Temp 98.5°F | Ht 73.0 in | Wt 361.0 lb

## 2021-09-15 DIAGNOSIS — R0609 Other forms of dyspnea: Secondary | ICD-10-CM

## 2021-09-15 MED ORDER — ALBUTEROL SULFATE HFA 108 (90 BASE) MCG/ACT IN AERS: 2.0000 | INHALATION_SPRAY | Freq: Four times a day (QID) | RESPIRATORY_TRACT | 6 refills | Status: DC | PRN

## 2021-09-15 NOTE — Patient Instructions (Signed)
-   Labs today ?- X-ray today ?- PFTs next visit (breathing tests) ?- albuterol 1-2 puffs as needed if you're feeling winded  ?- see you in 6 weeks! ?

## 2021-09-16 LAB — CBC WITH DIFFERENTIAL/PLATELET
Basophils Absolute: 0 10*3/uL (ref 0.0–0.1)
Basophils Relative: 0.4 % (ref 0.0–3.0)
Eosinophils Absolute: 0.2 10*3/uL (ref 0.0–0.7)
Eosinophils Relative: 2.1 % (ref 0.0–5.0)
HCT: 44.6 % (ref 39.0–52.0)
Hemoglobin: 15 g/dL (ref 13.0–17.0)
Lymphocytes Relative: 13.6 % (ref 12.0–46.0)
Lymphs Abs: 1.5 10*3/uL (ref 0.7–4.0)
MCHC: 33.7 g/dL (ref 30.0–36.0)
MCV: 86.3 fl (ref 78.0–100.0)
Monocytes Absolute: 0.9 10*3/uL (ref 0.1–1.0)
Monocytes Relative: 8.6 % (ref 3.0–12.0)
Neutro Abs: 8.3 10*3/uL — ABNORMAL HIGH (ref 1.4–7.7)
Neutrophils Relative %: 75.3 % (ref 43.0–77.0)
Platelets: 203 10*3/uL (ref 150.0–400.0)
RBC: 5.17 Mil/uL (ref 4.22–5.81)
RDW: 14.7 % (ref 11.5–15.5)
WBC: 11 10*3/uL — ABNORMAL HIGH (ref 4.0–10.5)

## 2021-09-16 LAB — BRAIN NATRIURETIC PEPTIDE: Pro B Natriuretic peptide (BNP): 34 pg/mL (ref 0.0–100.0)

## 2021-09-16 LAB — TSH: TSH: 1.86 u[IU]/mL (ref 0.35–5.50)

## 2021-10-06 DIAGNOSIS — I77810 Thoracic aortic ectasia: Secondary | ICD-10-CM | POA: Insufficient documentation

## 2021-10-06 HISTORY — DX: Thoracic aortic ectasia: I77.810

## 2021-10-18 ENCOUNTER — Telehealth: Payer: Self-pay

## 2021-10-18 ENCOUNTER — Encounter: Payer: Self-pay | Admitting: Cardiology

## 2021-10-18 NOTE — Telephone Encounter (Signed)
Called pt. He stated that he had been having some chest pain off and on for about a week. He reported some shortness of breath on exertion. He has nitroglycerin but has not used it. Encouraged pt to renew nitro and use it as needed. Encouraged pt to go to to the ER for evaluation. He stated that he would get the Nitroglycerin and if his pain persisted he would go to the ER for evaluation. He had no further questions.

## 2021-10-20 DIAGNOSIS — I517 Cardiomegaly: Secondary | ICD-10-CM | POA: Diagnosis not present

## 2021-10-20 DIAGNOSIS — E785 Hyperlipidemia, unspecified: Secondary | ICD-10-CM | POA: Diagnosis not present

## 2021-10-20 DIAGNOSIS — I214 Non-ST elevation (NSTEMI) myocardial infarction: Secondary | ICD-10-CM | POA: Diagnosis not present

## 2021-10-21 ENCOUNTER — Encounter (HOSPITAL_COMMUNITY)
Admission: EM | Disposition: A | Discharge status: Home / Self Care | Payer: Self-pay | Source: Other Acute Inpatient Hospital | Attending: Interventional Cardiology

## 2021-10-21 ENCOUNTER — Encounter (HOSPITAL_COMMUNITY): Payer: Self-pay | Admitting: Interventional Cardiology

## 2021-10-21 ENCOUNTER — Inpatient Hospital Stay (HOSPITAL_COMMUNITY)
Admission: EM | Admit: 2021-10-21 | Discharge: 2021-10-23 | DRG: 280 | Disposition: A | Discharge status: Home / Self Care | Payer: 59 | Source: Other Acute Inpatient Hospital | Attending: Cardiovascular Disease | Admitting: Cardiovascular Disease

## 2021-10-21 DIAGNOSIS — I5031 Acute diastolic (congestive) heart failure: Secondary | ICD-10-CM | POA: Diagnosis present

## 2021-10-21 DIAGNOSIS — I110 Hypertensive heart disease with heart failure: Secondary | ICD-10-CM | POA: Diagnosis present

## 2021-10-21 DIAGNOSIS — E7800 Pure hypercholesterolemia, unspecified: Secondary | ICD-10-CM | POA: Diagnosis present

## 2021-10-21 DIAGNOSIS — Z888 Allergy status to other drugs, medicaments and biological substances status: Secondary | ICD-10-CM | POA: Diagnosis not present

## 2021-10-21 DIAGNOSIS — G4730 Sleep apnea, unspecified: Secondary | ICD-10-CM | POA: Diagnosis present

## 2021-10-21 DIAGNOSIS — I10 Essential (primary) hypertension: Secondary | ICD-10-CM | POA: Diagnosis not present

## 2021-10-21 DIAGNOSIS — I214 Non-ST elevation (NSTEMI) myocardial infarction: Principal | ICD-10-CM | POA: Diagnosis present

## 2021-10-21 DIAGNOSIS — Z9861 Coronary angioplasty status: Secondary | ICD-10-CM

## 2021-10-21 DIAGNOSIS — I2510 Atherosclerotic heart disease of native coronary artery without angina pectoris: Secondary | ICD-10-CM | POA: Diagnosis not present

## 2021-10-21 DIAGNOSIS — Z79899 Other long term (current) drug therapy: Secondary | ICD-10-CM

## 2021-10-21 DIAGNOSIS — I5030 Unspecified diastolic (congestive) heart failure: Secondary | ICD-10-CM

## 2021-10-21 DIAGNOSIS — Z791 Long term (current) use of non-steroidal anti-inflammatories (NSAID): Secondary | ICD-10-CM

## 2021-10-21 DIAGNOSIS — Z6841 Body Mass Index (BMI) 40.0 and over, adult: Secondary | ICD-10-CM

## 2021-10-21 DIAGNOSIS — E785 Hyperlipidemia, unspecified: Secondary | ICD-10-CM | POA: Diagnosis not present

## 2021-10-21 DIAGNOSIS — Z7982 Long term (current) use of aspirin: Secondary | ICD-10-CM | POA: Diagnosis not present

## 2021-10-21 DIAGNOSIS — R739 Hyperglycemia, unspecified: Secondary | ICD-10-CM | POA: Diagnosis present

## 2021-10-21 DIAGNOSIS — I25110 Atherosclerotic heart disease of native coronary artery with unstable angina pectoris: Secondary | ICD-10-CM | POA: Diagnosis present

## 2021-10-21 HISTORY — DX: Non-ST elevation (NSTEMI) myocardial infarction: I21.4

## 2021-10-21 HISTORY — PX: LEFT HEART CATH AND CORONARY ANGIOGRAPHY: CATH118249

## 2021-10-21 SURGERY — LEFT HEART CATH AND CORONARY ANGIOGRAPHY
Anesthesia: LOCAL

## 2021-10-21 MED ORDER — HEPARIN (PORCINE) IN NACL 1000-0.9 UT/500ML-% IV SOLN
INTRAVENOUS | Status: AC
  Filled 2021-10-21: qty 500

## 2021-10-21 MED ORDER — ONDANSETRON HCL 4 MG/2ML IJ SOLN: 4.0000 mg | Freq: Four times a day (QID) | INTRAMUSCULAR | Status: DC | PRN

## 2021-10-21 MED ORDER — HEPARIN SODIUM (PORCINE) 1000 UNIT/ML IJ SOLN
INTRAMUSCULAR | Status: DC | PRN
  Administered 2021-10-21: 6000 [IU] via INTRAVENOUS

## 2021-10-21 MED ORDER — VERAPAMIL HCL 2.5 MG/ML IV SOLN
INTRAVENOUS | Status: AC
  Filled 2021-10-21: qty 2

## 2021-10-21 MED ORDER — ACETAMINOPHEN 325 MG PO TABS: 650.0000 mg | ORAL_TABLET | ORAL | Status: DC | PRN

## 2021-10-21 MED ORDER — SODIUM CHLORIDE 0.9 % WEIGHT BASED INFUSION: 3.0000 mL/kg/h | INTRAVENOUS | Status: DC

## 2021-10-21 MED ORDER — FUROSEMIDE 10 MG/ML IJ SOLN
60.0000 mg | Freq: Two times a day (BID) | INTRAMUSCULAR | Status: AC
  Administered 2021-10-21 – 2021-10-22 (×2): 60 mg via INTRAVENOUS
  Filled 2021-10-21 (×2): qty 6

## 2021-10-21 MED ORDER — ASPIRIN 81 MG PO CHEW: 81.0000 mg | CHEWABLE_TABLET | ORAL | Status: DC

## 2021-10-21 MED ORDER — SODIUM CHLORIDE 0.9 % IV SOLN: 250.0000 mL | INTRAVENOUS | Status: DC | PRN

## 2021-10-21 MED ORDER — FENTANYL CITRATE (PF) 100 MCG/2ML IJ SOLN
INTRAMUSCULAR | Status: DC | PRN
  Administered 2021-10-21 (×2): 25 ug via INTRAVENOUS

## 2021-10-21 MED ORDER — IOHEXOL 350 MG/ML SOLN
INTRAVENOUS | Status: DC | PRN
  Administered 2021-10-21: 135 mL

## 2021-10-21 MED ORDER — CLOPIDOGREL BISULFATE 300 MG PO TABS
ORAL_TABLET | ORAL | Status: AC
Start: 2021-10-21 — End: ?
  Filled 2021-10-21: qty 1

## 2021-10-21 MED ORDER — VERAPAMIL HCL 2.5 MG/ML IV SOLN: INTRAVENOUS | Status: DC | PRN

## 2021-10-21 MED ORDER — VERAPAMIL HCL 2.5 MG/ML IV SOLN
INTRAVENOUS | Status: DC | PRN
  Administered 2021-10-21: 2 mg via INTRA_ARTERIAL

## 2021-10-21 MED ORDER — SODIUM CHLORIDE 0.9% FLUSH: 3.0000 mL | INTRAVENOUS | Status: DC | PRN

## 2021-10-21 MED ORDER — SODIUM CHLORIDE 0.9% FLUSH
3.0000 mL | Freq: Two times a day (BID) | INTRAVENOUS | Status: DC
  Administered 2021-10-22: 3 mL via INTRAVENOUS

## 2021-10-21 MED ORDER — LIDOCAINE HCL (PF) 1 % IJ SOLN
INTRAMUSCULAR | Status: DC | PRN
  Administered 2021-10-21: 2 mL

## 2021-10-21 MED ORDER — ROSUVASTATIN CALCIUM 20 MG PO TABS
20.0000 mg | ORAL_TABLET | Freq: Every day | ORAL | Status: DC
  Administered 2021-10-21 – 2021-10-23 (×3): 20 mg via ORAL
  Filled 2021-10-21 (×3): qty 1

## 2021-10-21 MED ORDER — MIDAZOLAM HCL 2 MG/2ML IJ SOLN
INTRAMUSCULAR | Status: AC
  Filled 2021-10-21: qty 2

## 2021-10-21 MED ORDER — HEPARIN (PORCINE) IN NACL 1000-0.9 UT/500ML-% IV SOLN
INTRAVENOUS | Status: DC | PRN
  Administered 2021-10-21 (×2): 500 mL

## 2021-10-21 MED ORDER — FENTANYL CITRATE (PF) 100 MCG/2ML IJ SOLN
INTRAMUSCULAR | Status: AC
  Filled 2021-10-21: qty 2

## 2021-10-21 MED ORDER — SODIUM CHLORIDE 0.9 % WEIGHT BASED INFUSION: 1.0000 mL/kg/h | INTRAVENOUS | Status: DC

## 2021-10-21 MED ORDER — MIDAZOLAM HCL 2 MG/2ML IJ SOLN
INTRAMUSCULAR | Status: DC | PRN
  Administered 2021-10-21 (×2): 1 mg via INTRAVENOUS

## 2021-10-21 MED ORDER — CLOPIDOGREL BISULFATE 300 MG PO TABS
ORAL_TABLET | ORAL | Status: DC | PRN
  Administered 2021-10-21: 600 mg via ORAL

## 2021-10-21 MED ORDER — LIDOCAINE HCL (PF) 1 % IJ SOLN
INTRAMUSCULAR | Status: AC
Start: 2021-10-21 — End: ?
  Filled 2021-10-21: qty 30

## 2021-10-21 MED ORDER — SODIUM CHLORIDE 0.9 % WEIGHT BASED INFUSION
1.0000 mL/kg/h | INTRAVENOUS | Status: DC
  Administered 2021-10-21: 1 mL/kg/h via INTRAVENOUS

## 2021-10-21 MED ORDER — HEPARIN SODIUM (PORCINE) 1000 UNIT/ML IJ SOLN
INTRAMUSCULAR | Status: AC
Start: 2021-10-21 — End: ?
  Filled 2021-10-21: qty 10

## 2021-10-21 MED ORDER — HYDRALAZINE HCL 20 MG/ML IJ SOLN: 10.0000 mg | INTRAMUSCULAR | Status: AC | PRN

## 2021-10-21 MED ORDER — LABETALOL HCL 5 MG/ML IV SOLN: 10.0000 mg | INTRAVENOUS | Status: AC | PRN

## 2021-10-21 SURGICAL SUPPLY — 15 items
BAND ZEPHYR COMPRESS 30 LONG (HEMOSTASIS) ×1 IMPLANT
CATH 5FR JL3.5 JR4 ANG PIG MP (CATHETERS) ×1 IMPLANT
CATH INFINITI 5 FR AR2 MOD (CATHETERS) ×1 IMPLANT
CATH INFINITI 5FR AL1 (CATHETERS) ×1 IMPLANT
CATH LAUNCHER 5F EBU4.0 (CATHETERS) ×1 IMPLANT
GLIDESHEATH SLEND SS 6F .021 (SHEATH) ×1 IMPLANT
GUIDEWIRE INQWIRE 1.5J.035X260 (WIRE) IMPLANT
INQWIRE 1.5J .035X260CM (WIRE) ×2
KIT ENCORE 26 ADVANTAGE (KITS) ×1 IMPLANT
KIT HEART LEFT (KITS) ×2 IMPLANT
KIT HEMO VALVE WATCHDOG (MISCELLANEOUS) ×1 IMPLANT
MAT PREVALON FULL STRYKER (MISCELLANEOUS) ×1 IMPLANT
PACK CARDIAC CATHETERIZATION (CUSTOM PROCEDURE TRAY) ×2 IMPLANT
TRANSDUCER W/STOPCOCK (MISCELLANEOUS) ×2 IMPLANT
TUBING CIL FLEX 10 FLL-RA (TUBING) ×2 IMPLANT

## 2021-10-21 NOTE — Progress Notes (Signed)
Pt arrived in cath lab holding, bay 6, connected to monitor, denies pain, call bell given, safety maintained, Heparin gtt infusing into Left arm PIV at 1200u/hr and IV NS infusing into R arm PIV at 75 cc/hr

## 2021-10-21 NOTE — H&P (Signed)
Obstructive Cardiology Admission History and Physical:   Patient ID: Jerome Phelps MRN: [id number]; DOB: 1960-08-02   Admission date: (Not on file)  PCP:  Rick Duff, PA-C   [facility] Providers Cardiologist:  Gypsy Balsam, MD   {   Chief Complaint:  NSTEMI / Chest pain   Patient Profile:   Jerome Phelps is a 61 y.o. male with nonobstructive coronary artery disease, hypertension, sleep apnea on CPAP, morbid obesity, and hyperlipidemia  who is being seen 10/21/2021 for the evaluation of non-STEMI.  Cardiac catheterization in 2014 showing 20% LAD stenosis. Stress test February 2021 was low risk Echocardiogram December 2022 showed LV function of 50 to 55%, no regional wall motion abnormality, grade 1 diastolic dysfunction  Last seen by Dr. Bing Matter 4/70/2023.  Patient was having dyspnea on exertion.  Given prior reassuring stress test and echocardiogram patient was referred to pulmonologist who felt symptoms could be due to overweight and deconditioning. Plan to get PFT.    History of Present Illness:   Jerome Phelps presented to [facility] with exertional chest pain and shortness of breath 6/15.  Intermittent exertional chest pain started 2 weeks ago.  Intermittent lower extremity edema.  He is a [employer].  No prior tobacco smoking history.  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  Troponin 0.04>>1.3>>5.99.  Started on IV heparin.  Seen by Dr. Tomie China and transferred to [facility] for cardiac catheterization.  CT angio without pulmonary embolism.  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 Echocardiogram with Definity showed LV function of 60 to 65%.  Unable to exclude regional wall motion abnormality due to technical limitation.  Hemoglobin 14.7 Potassium 4.0 Creatinine 1.1 Glucose 231  Triglyceride 88, LDL 86, HDL 34, cholesterol 138   Past Medical History:  Diagnosis Date    Baker's cyst    Carotid artery occlusion    Hyperlipidemia    Sleep apnea    Venous insufficiency     Past Surgical History:  Procedure Laterality Date   CORONARY ANGIOPLASTY     Right/Left   KNEE SURGERY       Medications Prior to Admission: Prior to Admission medications   Medication Sig Start Date End Date Taking? Authorizing Provider  albuterol (VENTOLIN HFA) 108 (90 Base) MCG/ACT inhaler Inhale 2 puffs into the lungs every 6 (six) hours as needed for wheezing or shortness of breath. 09/15/21   Omar Person, MD  aspirin EC 325 MG tablet Take 325 mg by mouth daily.    [provider]  Fexofenadine-Pseudoephedrine (ALLEGRA-D 24 HOUR PO) Take 1 tablet by mouth daily as needed.    [provider]  losartan (COZAAR) 25 MG tablet Take 1 tablet (25 mg total) by mouth daily. Patient not taking: Reported on 09/15/2021 08/25/21 11/23/21  Georgeanna Lea, MD  meloxicam (MOBIC) 15 MG tablet Take 15 mg by mouth daily.    [provider]  Multiple Vitamin (MULTI-VITAMINS) TABS Take 1 tablet by mouth daily. Unknown strenght    [provider]     Allergies:    Allergies  Allergen Reactions   Atorvastatin Other (See Comments)    Muscle aches     Social History:   Social History   Socioeconomic History   Marital status: Married    Spouse name: Not on file   Number of children: Not on file   Years of education: Not on file   Highest education  level: Not on file  Occupational History   Not on file  Tobacco Use   Smoking status: Never   Smokeless tobacco: Never  Vaping Use   Vaping Use: Never used  Substance and Sexual Activity   Alcohol use: No   Drug use: No   Sexual activity: Not on file  Other Topics Concern   Not on file  Social History Narrative   Not on file   Social Determinants of Health   Financial Resource Strain: Not on file  Food Insecurity: Not on file  Transportation Needs: Not on file  Physical Activity: Not on  file  Stress: Not on file  Social Connections: Not on file  Intimate Partner Violence: Not on file    Family History:   The patient's family history includes Lung cancer in his father; Thyroid cancer in his mother.    ROS:  Please see the history of present illness.  All other ROS reviewed and negative.     Physical Exam/Data:  There were no vitals filed for this visit. No intake or output data in the 24 hours ending 10/21/21 0905    09/15/2021    3:32 PM 08/22/2021    8:14 AM 04/22/2021    3:20 PM  Last 3 Weights  Weight (lbs) 361 lb 363 lb 3.2 oz 367 lb 3.2 oz  Weight (kg) 163.749 kg 164.746 kg 166.561 kg     There is no height or weight on file to calculate BMI.  General:  Well nourished, well developed, in no acute distress HEENT: normal Neck: no JVD Vascular: No carotid bruits; 2+ right radial pulse   Cardiac:  normal S1, S2; RRR; no murmur  Lungs:  no wheezing Abd: obese Ext: no edema Musculoskeletal:  No deformities, BUE and BLE strength normal and equal Skin: warm and dry ; redness in right groin Neuro:  CNs 2-12 intact, no focal abnormalities noted Psych:  Normal affect    EKG:  The ECG that was done at [place] was personally reviewed and demonstrates normal sinus rhythm  Relevant CV Studies:  Carotid doppler 08/2021 Summary:  Right Carotid: There is no evidence of stenosis in the right ICA.   Left Carotid: There is no evidence of stenosis in the left ICA.   Echo 04/2021 1. Left ventricular ejection fraction, by estimation, is 50 to 55%. The  left ventricle has low normal function. Left ventricular endocardial  border not optimally defined to evaluate regional wall motion. There is  moderate concentric left ventricular  hypertrophy. Left ventricular diastolic parameters are consistent with  Grade I diastolic dysfunction (impaired relaxation).   2. Right ventricular systolic function is normal. The right ventricular  size is normal. Tricuspid  regurgitation signal is inadequate for assessing  PA pressure.   3. The mitral valve is normal in structure. No evidence of mitral valve  regurgitation. No evidence of mitral stenosis.   4. The aortic valve is tricuspid. Aortic valve regurgitation is not  visualized. No aortic stenosis is present.   5. There is moderate dilatation of the ascending aorta, measuring 40 mm.    Stress test 06/2019 Nuclear stress EF: 63%. The left ventricular ejection fraction is normal (55-65%). There was no ST segment deviation noted during stress. No T wave inversion was noted during stress. The study is normal. This is a low risk study.   Low risk stress nuclear study with normal perfusion and normal left ventricular regional and global systolic function.  Laboratory Data:  Reviewed  outside hospital records  High Sensitivity Troponin:  No results for input(s): "TROPONINIHS" in the last 720 hours.    ChemistryNo results for input(s): "NA", "K", "CL", "CO2", "GLUCOSE", "BUN", "CREATININE", "CALCIUM", "MG", "GFRNONAA", "GFRAA", "ANIONGAP" in the last 168 hours.  No results for input(s): "PROT", "ALBUMIN", "AST", "ALT", "ALKPHOS", "BILITOT" in the last 168 hours. Lipids No results for input(s): "CHOL", "TRIG", "HDL", "LABVLDL", "LDLCALC", "CHOLHDL" in the last 168 hours. HematologyNo results for input(s): "WBC", "RBC", "HGB", "HCT", "MCV", "MCH", "MCHC", "RDW", "PLT" in the last 168 hours. Thyroid No results for input(s): "TSH", "FREET4" in the last 168 hours. BNPNo results for input(s): "BNP", "PROBNP" in the last 168 hours.  DDimer No results for input(s): "DDIMER" in the last 168 hours.   Radiology/Studies:  No results found.   Assessment and Plan:   Non-STEMI Patient with longstanding history of dyspnea on exertion but now having intermittent chest pressure. Troponin 0.04>>1.3>>5.99.  Continue IV heparin.  Pending cardiac catheterization. Continue aspirin and Lipitor 20 mg  2.   Hyperglycemia -Check hemoglobin A1c  3.  Hypertension -Continue metoprolol   Risk Assessment/Risk Scores:    TIMI Risk Score for Unstable Angina or Non-ST Elevation MI:   The patient's TIMI risk score is  , which indicates a  % risk of all cause mortality, new or recurrent myocardial infarction or need for urgent revascularization in the next 14 days.  New York Heart Association (NYHA) Functional Class NYHA Class IV     Severity of Illness: The appropriate patient status for this patient is INPATIENT. Inpatient status is judged to be reasonable and necessary in order to provide the required intensity of service to ensure the patient's safety. The patient's presenting symptoms, physical exam findings, and initial radiographic and laboratory data in the context of their chronic comorbidities is felt to place them at high risk for further clinical deterioration. Furthermore, it is not anticipated that the patient will be medically stable for discharge from the hospital within 2 midnights of admission.   * I certify that at the point of admission it is my clinical judgment that the patient will require inpatient hospital care spanning beyond 2 midnights from the point of admission due to high intensity of service, high risk for further deterioration and high frequency of surveillance required.*   For questions or updates, please contact [facility] Please consult [email or web address] for contact info under     Signed, Manson Passey, PA  10/21/2021 9:05 AM    I have examined the patient and reviewed assessment and plan and discussed with patient.  Agree with above as stated.    Patient with mildly elevated troponin at [facility].  No chest pain since yesterday.  Cath revealed some disease noted in branch vessels.  There is no obvious culprit lesion.  LVEDP was very high.  I suspect his troponin may actually be from acute diastolic heart failure given his normal LV function by echo and by  cath.  Will treat coronary artery disease medically with dual antiplatelet therapy including Plavix.  Start high intensity statin.  We will start rosuvastatin as he was intolerant to atorvastatin due to muscle aches.  He will need other aggressive secondary prevention including healthy diet, regular exercise and weight loss.  I spoke to the wife and conveyed results to her as well as the plan.  Given his LVEDP of 30, I think he will benefit from IV Lasix.  Lance Muss

## 2021-10-21 NOTE — Progress Notes (Signed)
   Patient with NSTEMI at [facility].  Troponin up to 1.3.  NO CP since yesterday.  Cr 1.1  Cath Lab Visit (complete for each Cath Lab visit)  Clinical Evaluation Leading to the Procedure:   ACS: Yes.    Non-ACS:    Anginal Classification: CCS IV  Anti-ischemic medical therapy: Minimal Therapy (1 class of medications)  Non-Invasive Test Results: No non-invasive testing performed  Prior CABG: No previous CABG  Corky Crafts, MD

## 2021-10-22 DIAGNOSIS — I5030 Unspecified diastolic (congestive) heart failure: Secondary | ICD-10-CM

## 2021-10-22 DIAGNOSIS — E7800 Pure hypercholesterolemia, unspecified: Secondary | ICD-10-CM

## 2021-10-22 DIAGNOSIS — I10 Essential (primary) hypertension: Secondary | ICD-10-CM

## 2021-10-22 DIAGNOSIS — I5031 Acute diastolic (congestive) heart failure: Secondary | ICD-10-CM

## 2021-10-22 LAB — BASIC METABOLIC PANEL
Anion gap: 7 (ref 5–15)
BUN: 18 mg/dL (ref 8–23)
CO2: 28 mmol/L (ref 22–32)
Calcium: 8.7 mg/dL — ABNORMAL LOW (ref 8.9–10.3)
Chloride: 103 mmol/L (ref 98–111)
Creatinine, Ser: 1.26 mg/dL — ABNORMAL HIGH (ref 0.61–1.24)
GFR, Estimated: >60 mL/min (ref >60)
Glucose, Bld: 99 mg/dL (ref 70–99)
Potassium: 4.3 mmol/L (ref 3.5–5.1)
Sodium: 138 mmol/L (ref 135–145)

## 2021-10-22 MED ORDER — ASPIRIN 81 MG PO TBEC
81.0000 mg | DELAYED_RELEASE_TABLET | Freq: Every day | ORAL | Status: DC
Start: 2021-10-22 — End: 2021-10-23
  Administered 2021-10-22 – 2021-10-23 (×2): 81 mg via ORAL
  Filled 2021-10-22 (×2): qty 1

## 2021-10-22 MED ORDER — CLOPIDOGREL BISULFATE 75 MG PO TABS
75.0000 mg | ORAL_TABLET | Freq: Every day | ORAL | Status: DC
  Administered 2021-10-22 – 2021-10-23 (×2): 75 mg via ORAL
  Filled 2021-10-22 (×2): qty 1

## 2021-10-22 MED ORDER — CARVEDILOL 6.25 MG PO TABS
6.2500 mg | ORAL_TABLET | Freq: Two times a day (BID) | ORAL | Status: DC
  Administered 2021-10-22 – 2021-10-23 (×2): 6.25 mg via ORAL
  Filled 2021-10-22 (×2): qty 1

## 2021-10-22 NOTE — Progress Notes (Signed)
Progress Note  Patient Name: Jerome Phelps Date of Encounter: 10/22/2021  [facility] Cardiologist: Gypsy Balsam, MD   Subjective   Feeling much better.  He was able to walk around the room without chest pain.  Inpatient Medications    Scheduled Meds:  aspirin EC  81 mg Oral Daily   carvedilol  6.25 mg Oral BID WC   clopidogrel  75 mg Oral Daily   rosuvastatin  20 mg Oral Daily   sodium chloride flush  3 mL Intravenous Q12H   sodium chloride flush  3 mL Intravenous Q12H   Continuous Infusions:  sodium chloride     sodium chloride     PRN Meds: sodium chloride, sodium chloride, acetaminophen, ondansetron (ZOFRAN) IV, sodium chloride flush, sodium chloride flush   Vital Signs    Vitals:   10/21/21 1600 10/21/21 1714 10/21/21 2030 10/22/21 0435  BP: (!) 155/90 (!) 144/81 (!) 175/60 (!) 143/87  Pulse: 67  80   Resp:   18 18  Temp:   98.5 F (36.9 C) 98.6 F (37 C)  TempSrc:   Oral Oral  SpO2: 100%  95%   Weight:    (!) 159.2 kg  Height:        Intake/Output Summary (Last 24 hours) at 10/22/2021 0856 Last data filed at 10/22/2021 0400 Gross per 24 hour  Intake 240 ml  Output 4550 ml  Net -4310 ml      10/22/2021    4:35 AM 10/21/2021   11:39 AM 09/15/2021    3:32 PM  Last 3 Weights  Weight (lbs) 350 lb 14.4 oz 357 lb 12.9 oz 361 lb  Weight (kg) 159.167 kg 162.3 kg 163.749 kg      Telemetry    Sinus rhythm/sinus tachycardia.  PVCs- Personally Reviewed  ECG    N/a - Personally Reviewed  Physical Exam   VS:  BP (!) 143/87 (BP Location: Left Leg)   Pulse 80   Temp 98.6 F (37 C) (Oral)   Resp 18   Ht 6\' 1"  (1.854 m)   Wt (!) 159.2 kg   SpO2 95%   BMI 46.30 kg/m  , BMI Body mass index is 46.3 kg/m. GENERAL:  Well appearing HEENT: Pupils equal round and reactive, fundi not visualized, oral mucosa unremarkable NECK:  No jugular venous distention, waveform within normal limits, carotid upstroke brisk and symmetric, no bruits, no  thyromegaly LUNGS:  Clear to auscultation bilaterally HEART:  RRR.  PMI not displaced or sustained,S1 and S2 within normal limits, no S3, no S4, no clicks, no rubs, no murmurs ABD:  Flat, positive bowel sounds normal in frequency in pitch, no bruits, no rebound, no guarding, no midline pulsatile mass, no hepatomegaly, no splenomegaly EXT:  2 plus pulses throughout, no edema, no cyanosis no clubbing SKIN:  No rashes no nodules NEURO:  Cranial nerves II through XII grossly intact, motor grossly intact throughout PSYCH:  Cognitively intact, oriented to person place and time   Labs    High Sensitivity Troponin:  No results for input(s): "TROPONINIHS" in the last 720 hours.   Chemistry Recent Labs  Lab 10/22/21 0125  NA 138  K 4.3  CL 103  CO2 28  GLUCOSE 99  BUN 18  CREATININE 1.26*  CALCIUM 8.7*  GFRNONAA >60  ANIONGAP 7    Lipids No results for input(s): "CHOL", "TRIG", "HDL", "LABVLDL", "LDLCALC", "CHOLHDL" in the last 168 hours.  HematologyNo results for input(s): "WBC", "RBC", "HGB", "HCT", "MCV", "MCH", "MCHC", "  RDW", "PLT" in the last 168 hours. Thyroid No results for input(s): "TSH", "FREET4" in the last 168 hours.  BNPNo results for input(s): "BNP", "PROBNP" in the last 168 hours.  DDimer No results for input(s): "DDIMER" in the last 168 hours.   Radiology    CARDIAC CATHETERIZATION  Result Date: 10/21/2021   LPAV lesion is 50% stenosed.   1st Mrg lesion is 70% stenosed.   LV end diastolic pressure is severely elevated.  LVEDP 30 mmHg.   The left ventricular ejection fraction is 55-65% by visual estimate.   There is no aortic valve stenosis.   Moderate, diffuse coronary disease noted throughout the left system, most prominently in the LAD. Mildly elevated troponin done at [facility].  No obvious culprit lesion.  His LVEDP is very high.  He has some diffuse, branch vessel lesions.  This may have been demand ischemia, which is consistent with his normal EF noted by  echo and by cath.  Avoid diuresis aggressively and try to optimize medications.  He will need aggressive secondary prevention including statin therapy.  Clopidogrel also started in the Cath Lab due to elevated troponin.    Cardiac Studies   CT angio without pulmonary embolism.  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  Echocardiogram with Definity showed LV function of 60 to 65%.  Unable to exclude regional wall motion abnormality due to technical limitation.  Patient Profile     61 y.o. male with hypertension, hyperlipidemia, OSA, carotid stenosis admitted with NSTEMI and acute diastolic heart failure.  Assessment & Plan    #NSTEMI: #Pure hypercholesterolemia: Patient underwent left heart cath 6/16 and was found to have moderate disease.  Medically managed.  His home aspirin 325 mg was switched to 81 mg.  Plan for clopidogrel for 1 year.  Continue rosuvastatin.  Check fasting lipids to ensure his LDL is less than 70.  LP(a) is pending.  We will add carvedilol 6.25 mg twice daily.  #Acute diastolic heart failure: #Essential hypertension: LVEF was within normal limits at outside hospital.  LVEDP was 30 mmHg on cath.  He diuresed very well overnight.  He received another dose of IV Lasix today.  Creatinine was slightly elevated.  Therefore we will not add back his home losartan yet until his renal function stabilizes.  Adding carvedilol as above.  Blood pressure goal less than 1 130/80.  If renal function and volume status are stable tomorrow, plan to discharge home.  Recommend adding Farxiga at discharge.  He works as a Naval architect.  We discussed the importance of limiting sodium intake, especially when on the road.  For questions or updates, please contact [facility] Please consult [email or web address] for contact info under        Signed, Chilton Si, MD  10/22/2021, 8:56 AM

## 2021-10-23 ENCOUNTER — Other Ambulatory Visit: Payer: Self-pay | Admitting: Physician Assistant

## 2021-10-23 DIAGNOSIS — N179 Acute kidney failure, unspecified: Secondary | ICD-10-CM

## 2021-10-23 LAB — CBC
HCT: 44.8 % (ref 39.0–52.0)
Hemoglobin: 15.4 g/dL (ref 13.0–17.0)
MCH: 29.7 pg (ref 26.0–34.0)
MCHC: 34.4 g/dL (ref 30.0–36.0)
MCV: 86.3 fL (ref 80.0–100.0)
Platelets: 179 10*3/uL (ref 150–400)
RBC: 5.19 MIL/uL (ref 4.22–5.81)
RDW: 14.3 % (ref 11.5–15.5)
WBC: 9.1 10*3/uL (ref 4.0–10.5)
nRBC: 0 % (ref 0.0–0.2)

## 2021-10-23 LAB — COMPREHENSIVE METABOLIC PANEL
ALT: 27 U/L (ref 0–44)
AST: 29 U/L (ref 15–41)
Albumin: 3.5 g/dL (ref 3.5–5.0)
Alkaline Phosphatase: 85 U/L (ref 38–126)
Anion gap: 11 (ref 5–15)
BUN: 22 mg/dL (ref 8–23)
CO2: 27 mmol/L (ref 22–32)
Calcium: 9 mg/dL (ref 8.9–10.3)
Chloride: 103 mmol/L (ref 98–111)
Creatinine, Ser: 1.34 mg/dL — ABNORMAL HIGH (ref 0.61–1.24)
GFR, Estimated: 60 mL/min — ABNORMAL LOW (ref >60)
Glucose, Bld: 148 mg/dL — ABNORMAL HIGH (ref 70–99)
Potassium: 4.2 mmol/L (ref 3.5–5.1)
Sodium: 141 mmol/L (ref 135–145)
Total Bilirubin: 0.8 mg/dL (ref 0.3–1.2)
Total Protein: 6.2 g/dL — ABNORMAL LOW (ref 6.5–8.1)

## 2021-10-23 LAB — LIPID PANEL
Cholesterol: 132 mg/dL (ref 0–200)
HDL: 35 mg/dL — ABNORMAL LOW (ref >40)
LDL Cholesterol: 66 mg/dL (ref 0–99)
Total CHOL/HDL Ratio: 3.8 RATIO
Triglycerides: 156 mg/dL — ABNORMAL HIGH (ref <150)
VLDL: 31 mg/dL (ref 0–40)

## 2021-10-23 MED ORDER — ROSUVASTATIN CALCIUM 20 MG PO TABS
20.0000 mg | ORAL_TABLET | Freq: Every day | ORAL | 3 refills | Status: DC
Start: 2021-10-24 — End: 2022-07-21

## 2021-10-23 MED ORDER — FUROSEMIDE 40 MG PO TABS
40.0000 mg | ORAL_TABLET | Freq: Every day | ORAL | 1 refills | Status: DC
Start: 2021-10-25 — End: 2021-12-30

## 2021-10-23 MED ORDER — LOSARTAN POTASSIUM 25 MG PO TABS: 25.0000 mg | ORAL_TABLET | Freq: Every day | ORAL | 1 refills | Status: DC

## 2021-10-23 MED ORDER — POTASSIUM CHLORIDE ER 10 MEQ PO TBCR
10.0000 meq | EXTENDED_RELEASE_TABLET | Freq: Every day | ORAL | 1 refills | Status: DC
Start: 2021-10-25 — End: 2022-02-23

## 2021-10-23 MED ORDER — ASPIRIN 81 MG PO TBEC: 81.0000 mg | DELAYED_RELEASE_TABLET | Freq: Every day | ORAL | Status: AC

## 2021-10-23 MED ORDER — CLOPIDOGREL BISULFATE 75 MG PO TABS: 75.0000 mg | ORAL_TABLET | Freq: Every day | ORAL | 3 refills | Status: DC

## 2021-10-23 MED ORDER — CARVEDILOL 6.25 MG PO TABS: 6.2500 mg | ORAL_TABLET | Freq: Two times a day (BID) | ORAL | 3 refills | Status: DC

## 2021-10-23 NOTE — Discharge Summary (Signed)
Discharge Summary    Patient ID: Jerome Phelps MRN: [id number]; DOB: [birth year]  Admit date: 10/21/2021 Discharge date: 10/23/2021  PCP:  Noni Saupe, MD   [facility] Providers Cardiologist:  Gypsy Balsam, MD        Discharge Diagnoses    Principal Problem:   NSTEMI (non-ST elevated myocardial infarction) [facility]) Active Problems:   Pure hypercholesterolemia   Acute diastolic heart failure [facility])    Diagnostic Studies/Procedures    Cath 10/21/2021   LPAV lesion is 50% stenosed.   1st Mrg lesion is 70% stenosed.   LV end diastolic pressure is severely elevated.  LVEDP 30 mmHg.   The left ventricular ejection fraction is 55-65% by visual estimate.   There is no aortic valve stenosis.   Moderate, diffuse coronary disease noted throughout the left system, most prominently in the LAD.   Mildly elevated troponin done at [facility].  No obvious culprit lesion.  His LVEDP is very high.  He has some diffuse, branch vessel lesions.  This may have been demand ischemia, which is consistent with his normal EF noted by echo and by cath.  Avoid diuresis aggressively and try to optimize medications.  He will need aggressive secondary prevention including statin therapy.  Clopidogrel also started in the Cath Lab due to elevated troponin. _____________   History of Present Illness     Jerome Phelps is a 61 y.o. male with nonobstructive coronary artery disease, hypertension, sleep apnea on CPAP, morbid obesity, and hyperlipidemia  who is being seen 10/21/2021 for the evaluation of non-STEMI.   Cardiac catheterization in 2014 showing 20% LAD stenosis. Stress test February 2021 was low risk Echocardiogram December 2022 showed LV function of 50 to 55%, no regional wall motion abnormality, grade 1 diastolic dysfunction   Last seen by Dr. Bing Matter 4/70/2023.  Patient was having dyspnea on exertion.  Given prior reassuring stress test and echocardiogram patient was referred to  pulmonologist who felt symptoms could be due to overweight and deconditioning. Plan to get PFT.   [name] presented to [facility] with exertional chest pain and shortness of breath 6/15.  Intermittent exertional chest pain started 2 weeks ago.  Intermittent lower extremity edema.  He is a [employer].  No prior tobacco smoking history.  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  Troponin 0.04>>1.3>>5.99.  Started on IV heparin.  Seen by Dr. Tomie China and transferred to [facility] for cardiac catheterization.   CT angio without pulmonary embolism.  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 Echocardiogram with Definity showed LV function of 60 to 65%.  Unable to exclude regional wall motion abnormality due to technical limitation.   Hemoglobin 14.7 Potassium 4.0 Creatinine 1.1 Glucose 231   Triglyceride 88, LDL 86, HDL 34, cholesterol 138  Hospital Course     Consultants: N/A   Patient was transferred to [facility] for NSTEMI.  Cardiac catheterization performed on 10/21/2021 demonstrated 50% RPA V lesion, 70% OM1 lesion, LVEDP 30 mmHg, EF 55 to 65%, moderate diffuse CAD noted throughout the left system most prominent in the LAD.  No culprit lesion was identified.  Given the significantly elevated LVEDP and the diffuse branch vessel lesion, it was felt that elevation of the troponin could be due to demand ischemia.  He underwent IV diuresis overnight.  Plavix was started in the Cath Lab due to elevated troponin.  CTA obtained at [facility] shows  no PE.  Home aspirin 325 mg was switched to 81 mg daily.  The plan is to continue Plavix therapy for 1 year.  Lipoprotein a lab is currently pending.  Carvedilol 6.25 mg twice a day was added to his medical regimen.  Due to slight bump in creatinine, home Lasix and losartan were held.  Dr. Duke Salvia recommended resume both Lasix and losartan this Tuesday and obtain  basic metabolic panel near the end of the week.  Dr. Duke Salvia also recommended [facility] [place] upon discharge, however given the bump in the creatinine, she recommended we consider Farxiga on the next follow-up.   Did the patient have an acute coronary syndrome (MI, NSTEMI, STEMI, etc) this admission?:  No.   The elevated Troponin was due to the acute medical illness (demand ischemia).         _____________  Discharge Vitals Blood pressure 131/87, pulse 86, temperature 98.3 F (36.8 C), temperature source Oral, resp. rate 18, height 6\' 1"  (1.854 m), weight (!) 158.8 kg, SpO2 95 %.  Filed Weights   10/21/21 1139 10/22/21 0435 10/23/21 0748  Weight: (!) 162.3 kg (!) 159.2 kg (!) 158.8 kg    Labs & Radiologic Studies    CBC Recent Labs    10/23/21 0930  WBC 9.1  HGB 15.4  HCT 44.8  MCV 86.3  PLT 179   Basic Metabolic Panel Recent Labs    10/25/21 0125 10/23/21 0930  NA 138 141  K 4.3 4.2  CL 103 103  CO2 28 27  GLUCOSE 99 148*  BUN 18 22  CREATININE 1.26* 1.34*  CALCIUM 8.7* 9.0   Liver Function Tests Recent Labs    10/23/21 0930  AST 29  ALT 27  ALKPHOS 85  BILITOT 0.8  PROT 6.2*  ALBUMIN 3.5   No results for input(s): "LIPASE", "AMYLASE" in the last 72 hours. High Sensitivity Troponin:   No results for input(s): "TROPONINIHS" in the last 720 hours.  BNP Invalid input(s): "POCBNP" D-Dimer No results for input(s): "DDIMER" in the last 72 hours. Hemoglobin A1C No results for input(s): "HGBA1C" in the last 72 hours. Fasting Lipid Panel Recent Labs    10/23/21 0930  CHOL 132  HDL 35*  LDLCALC 66  TRIG 10/25/21*  CHOLHDL 3.8   Thyroid Function Tests No results for input(s): "TSH", "T4TOTAL", "T3FREE", "THYROIDAB" in the last 72 hours.  Invalid input(s): "FREET3" _____________  CARDIAC CATHETERIZATION  Result Date: 10/21/2021   LPAV lesion is 50% stenosed.   1st Mrg lesion is 70% stenosed.   LV end diastolic pressure is severely elevated.  LVEDP 30 mmHg.    The left ventricular ejection fraction is 55-65% by visual estimate.   There is no aortic valve stenosis.   Moderate, diffuse coronary disease noted throughout the left system, most prominently in the LAD. Mildly elevated troponin done at [facility].  No obvious culprit lesion.  His LVEDP is very high.  He has some diffuse, branch vessel lesions.  This may have been demand ischemia, which is consistent with his normal EF noted by echo and by cath.  Avoid diuresis aggressively and try to optimize medications.  He will need aggressive secondary prevention including statin therapy.  Clopidogrel also started in the Cath Lab due to elevated troponin.    Disposition   Pt is being discharged home today in good condition.  Follow-up Plans & Appointments     Follow-up Information     [facility], MD Follow up.   Specialty: Cardiology  Why: Office scheduler will contact you to arrange BMET blood work on 6/23 and then follow up with Dr. Bing Matter in our [place] office after that, if you do not hear from our scheduler, please give [place] a call in 3 business days. Contact information: [address] [place] [address] [phone]                  Discharge Medications   Allergies as of 10/23/2021       Reactions   Atorvastatin Other (See Comments)   Muscle aches and made the legs sore        Medication List     STOP taking these medications    pravastatin 40 MG tablet Commonly known as: PRAVACHOL       TAKE these medications    albuterol 108 (90 Base) MCG/ACT inhaler Commonly known as: VENTOLIN HFA Inhale 2 puffs into the lungs every 6 (six) hours as needed for wheezing or shortness of breath.   ALLEGRA-D 24 HOUR PO Take 1 tablet by mouth daily as needed (for allergies).   aspirin EC 81 MG tablet Take 1 tablet (81 mg total) by mouth daily. Swallow whole. Start taking on: October 24, 2021 What changed:  medication strength how much to take additional  instructions   carvedilol 6.25 MG tablet Commonly known as: COREG Take 1 tablet (6.25 mg total) by mouth 2 (two) times daily with a meal.   clopidogrel 75 MG tablet Commonly known as: PLAVIX Take 1 tablet (75 mg total) by mouth daily. Start taking on: October 24, 2021   furosemide 40 MG tablet Commonly known as: LASIX Take 1 tablet (40 mg total) by mouth daily. Start taking on: October 25, 2021 What changed: These instructions start on October 25, 2021. If you are unsure what to do until then, ask your doctor or other care provider.   losartan 25 MG tablet Commonly known as: COZAAR Take 1 tablet (25 mg total) by mouth daily. Start taking on: October 25, 2021 What changed: These instructions start on October 25, 2021. If you are unsure what to do until then, ask your doctor or other care provider.   meloxicam 15 MG tablet Commonly known as: MOBIC Take 15 mg by mouth daily.   multivitamin Tabs tablet Take 1 tablet by mouth daily with breakfast.   potassium chloride 10 MEQ tablet Commonly known as: KLOR-CON Take 1 tablet (10 mEq total) by mouth daily. Start taking on: October 25, 2021 What changed: These instructions start on October 25, 2021. If you are unsure what to do until then, ask your doctor or other care provider.   rosuvastatin 20 MG tablet Commonly known as: CRESTOR Take 1 tablet (20 mg total) by mouth daily. Start taking on: October 24, 2021   traMADol 50 MG tablet Commonly known as: ULTRAM Take 50 mg by mouth every 6 (six) hours as needed (for pain).           Outstanding Labs/Studies   BMET on 6/23  Duration of Discharge Encounter   Greater than 30 minutes including physician time.  Ramond Dial, PA 10/23/2021, 12:24 PM

## 2021-10-23 NOTE — Progress Notes (Signed)
Progress Note  Patient Name: Jerome Phelps Date of Encounter: 10/23/2021  [facility] Cardiologist: Gypsy Balsam, MD   Subjective   Feeling much better.  He was able to walk around the room without chest pain.  Eager to go home.   Inpatient Medications    Scheduled Meds:  aspirin EC  81 mg Oral Daily   carvedilol  6.25 mg Oral BID WC   clopidogrel  75 mg Oral Daily   rosuvastatin  20 mg Oral Daily   sodium chloride flush  3 mL Intravenous Q12H   sodium chloride flush  3 mL Intravenous Q12H   Continuous Infusions:  sodium chloride     sodium chloride     PRN Meds: sodium chloride, sodium chloride, acetaminophen, ondansetron (ZOFRAN) IV, sodium chloride flush, sodium chloride flush   Vital Signs    Vitals:   10/22/21 1624 10/22/21 2120 10/23/21 0748 10/23/21 0809  BP: 121/85 (!) 141/74  131/87  Pulse: 83 81  86  Resp:  18    Temp:  98.3 F (36.8 C)    TempSrc:  Oral    SpO2:  95%    Weight:   (!) 158.8 kg   Height:        Intake/Output Summary (Last 24 hours) at 10/23/2021 0941 Last data filed at 10/22/2021 2117 Gross per 24 hour  Intake 590 ml  Output 200 ml  Net 390 ml      10/23/2021    7:48 AM 10/22/2021    4:35 AM 10/21/2021   11:39 AM  Last 3 Weights  Weight (lbs) 350 lb 350 lb 14.4 oz 357 lb 12.9 oz  Weight (kg) 158.759 kg 159.167 kg 162.3 kg      Telemetry    Sinus rhythm.  No events. Personally Reviewed  ECG    N/a - Personally Reviewed  Physical Exam   VS:  BP 131/87   Pulse 86   Temp 98.3 F (36.8 C) (Oral)   Resp 18   Ht 6\' 1"  (1.854 m)   Wt (!) 158.8 kg   SpO2 95%   BMI 46.18 kg/m  , BMI Body mass index is 46.18 kg/m. GENERAL:  Well appearing HEENT: Pupils equal round and reactive, fundi not visualized, oral mucosa unremarkable NECK:  No jugular venous distention, waveform within normal limits, carotid upstroke brisk and symmetric, no bruits, no thyromegaly LUNGS:  Clear to auscultation bilaterally HEART:  RRR.  PMI  not displaced or sustained,S1 and S2 within normal limits, no S3, no S4, no clicks, no rubs, no murmurs ABD:  Flat, positive bowel sounds normal in frequency in pitch, no bruits, no rebound, no guarding, no midline pulsatile mass, no hepatomegaly, no splenomegaly EXT:  2 plus pulses throughout, no edema, no cyanosis no clubbing SKIN:  No rashes no nodules NEURO:  Cranial nerves II through XII grossly intact, motor grossly intact throughout PSYCH:  Cognitively intact, oriented to person place and time   Labs    High Sensitivity Troponin:  No results for input(s): "TROPONINIHS" in the last 720 hours.   Chemistry Recent Labs  Lab 10/22/21 0125  NA 138  K 4.3  CL 103  CO2 28  GLUCOSE 99  BUN 18  CREATININE 1.26*  CALCIUM 8.7*  GFRNONAA >60  ANIONGAP 7    Lipids No results for input(s): "CHOL", "TRIG", "HDL", "LABVLDL", "LDLCALC", "CHOLHDL" in the last 168 hours.  HematologyNo results for input(s): "WBC", "RBC", "HGB", "HCT", "MCV", "MCH", "MCHC", "RDW", "PLT" in the last  168 hours. Thyroid No results for input(s): "TSH", "FREET4" in the last 168 hours.  BNPNo results for input(s): "BNP", "PROBNP" in the last 168 hours.  DDimer No results for input(s): "DDIMER" in the last 168 hours.   Radiology    CARDIAC CATHETERIZATION  Result Date: 10/21/2021   LPAV lesion is 50% stenosed.   1st Mrg lesion is 70% stenosed.   LV end diastolic pressure is severely elevated.  LVEDP 30 mmHg.   The left ventricular ejection fraction is 55-65% by visual estimate.   There is no aortic valve stenosis.   Moderate, diffuse coronary disease noted throughout the left system, most prominently in the LAD. Mildly elevated troponin done at [facility].  No obvious culprit lesion.  His LVEDP is very high.  He has some diffuse, branch vessel lesions.  This may have been demand ischemia, which is consistent with his normal EF noted by echo and by cath.  Avoid diuresis aggressively and try to optimize  medications.  He will need aggressive secondary prevention including statin therapy.  Clopidogrel also started in the Cath Lab due to elevated troponin.    Cardiac Studies   CT angio without pulmonary embolism.  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  Echocardiogram with Definity showed LV function of 60 to 65%.  Unable to exclude regional wall motion abnormality due to technical limitation.  Patient Profile     61 y.o. male with hypertension, hyperlipidemia, OSA, carotid stenosis admitted with NSTEMI and acute diastolic heart failure.  Assessment & Plan    #NSTEMI: #Pure hypercholesterolemia: Patient underwent left heart cath 6/16 and was found to have moderate disease.  Medically managed.  His home aspirin 325 mg was switched to 81 mg.  Plan for clopidogrel for 1 year.  Home pravastatin was switched to rosuvastatin.  Lipid panel and LP(a) is pending.  BP stable after adding carvedilol 6.25 mg twice daily.  #Acute diastolic heart failure: #Essential hypertension: LVEF was within normal limits at outside hospital.  LVEDP was 30 mmHg on cath.  He diuresed very well with IV lasix and feels better. He received another dose of IV Lasix yesterday.  BMP pending. If his creatinine is back to baseline, add back home losartan and PO lasix.  Added carvedilol as above.  Blood pressure goal less than 1 130/80.  Recommend adding Farxiga at discharge.  He works as a Naval architect.  We discussed the importance of limiting sodium intake, especially when on the road.  For questions or updates, please contact [facility] Please consult [email or web address] for contact info under        Signed, Chilton Si, MD  10/23/2021, 9:41 AM

## 2021-10-23 NOTE — Discharge Instructions (Signed)
Hold off on restart losartan, potassium and lasix until Tuesday 10/25/2021, you will need a BMET blood work to check kidney function and electrolyte on Friday 6/23, our scheduler will contact you to arrange that. Our scheduler will also contact you to arrange follow up with Dr. Bing Matter.  No lifting over 5 lbs for 1 week. No sexual activity for 1 week. Keep procedure site clean & dry. If you notice increased pain, swelling, bleeding or pus, call/return!  You may shower, but no soaking baths/hot tubs/pools for 1 week.

## 2021-10-24 ENCOUNTER — Encounter (HOSPITAL_COMMUNITY): Payer: Self-pay | Admitting: Interventional Cardiology

## 2021-10-24 LAB — LIPOPROTEIN A (LPA): Lipoprotein (a): 34.7 nmol/L — ABNORMAL HIGH (ref <75.0)

## 2021-10-26 ENCOUNTER — Encounter (HOSPITAL_COMMUNITY): Payer: Self-pay | Admitting: Interventional Cardiology

## 2021-10-27 ENCOUNTER — Encounter: Payer: Self-pay | Admitting: Cardiology

## 2021-10-27 DIAGNOSIS — E663 Overweight: Secondary | ICD-10-CM | POA: Insufficient documentation

## 2021-10-27 HISTORY — DX: Overweight: E66.3

## 2021-10-29 LAB — AST: AST: 10 IU/L (ref 0–40)

## 2021-10-29 LAB — LIPID PANEL
Chol/HDL Ratio: 3.2 ratio (ref 0.0–5.0)
Cholesterol, Total: 120 mg/dL (ref 100–199)
HDL: 38 mg/dL — ABNORMAL LOW (ref >39)
LDL Chol Calc (NIH): 59 mg/dL (ref 0–99)
Triglycerides: 131 mg/dL (ref 0–149)
VLDL Cholesterol Cal: 23 mg/dL (ref 5–40)

## 2021-10-29 LAB — ALT: ALT: 18 IU/L (ref 0–44)

## 2021-11-07 ENCOUNTER — Other Ambulatory Visit: Payer: Self-pay

## 2021-11-07 ENCOUNTER — Ambulatory Visit: Payer: 59 | Admitting: Pulmonary Disease

## 2021-11-07 DIAGNOSIS — N179 Acute kidney failure, unspecified: Secondary | ICD-10-CM

## 2021-11-08 LAB — BASIC METABOLIC PANEL
BUN/Creatinine Ratio: 14 (ref 10–24)
BUN: 16 mg/dL (ref 8–27)
CO2: 24 mmol/L (ref 20–29)
Calcium: 8.5 mg/dL — ABNORMAL LOW (ref 8.6–10.2)
Chloride: 104 mmol/L (ref 96–106)
Creatinine, Ser: 1.18 mg/dL (ref 0.76–1.27)
Glucose: 105 mg/dL — ABNORMAL HIGH (ref 70–99)
Potassium: 4.4 mmol/L (ref 3.5–5.2)
Sodium: 139 mmol/L (ref 134–144)
eGFR: 69 mL/min/{1.73_m2} (ref >59)

## 2021-11-09 ENCOUNTER — Telehealth: Payer: Self-pay

## 2021-11-09 NOTE — Telephone Encounter (Signed)
Spoke with pt about lab results and message. He stated that he was feeling ok. He stated that his second dose of Metoprolol makes him weak and tired feeling. He takes it around 4pm. Will talk with Dr. Bing Matter regarding 2nd dose of Metoprolol. Dr. Bing Matter advised pt to take 2nd dose of metoprolol at bedtime to see if this will help how he is feeling. Pt verbalized understanding and had no further questions.

## 2021-11-30 ENCOUNTER — Ambulatory Visit: Payer: 59 | Admitting: Cardiology

## 2021-12-06 ENCOUNTER — Ambulatory Visit: Payer: 59 | Admitting: Cardiology

## 2021-12-06 ENCOUNTER — Encounter: Payer: Self-pay | Admitting: Cardiology

## 2021-12-06 VITALS — BP 122/76 | HR 64 | Ht 73.0 in | Wt 357.6 lb

## 2021-12-06 DIAGNOSIS — I10 Essential (primary) hypertension: Secondary | ICD-10-CM

## 2021-12-06 DIAGNOSIS — I2510 Atherosclerotic heart disease of native coronary artery without angina pectoris: Secondary | ICD-10-CM | POA: Diagnosis not present

## 2021-12-06 DIAGNOSIS — E785 Hyperlipidemia, unspecified: Secondary | ICD-10-CM

## 2021-12-06 DIAGNOSIS — I214 Non-ST elevation (NSTEMI) myocardial infarction: Secondary | ICD-10-CM

## 2021-12-06 NOTE — Progress Notes (Signed)
Cardiology Office Note:    Date:  12/06/2021   ID:  Jerome Phelps, DOB 02-18-1961, MRN [id number]  PCP:  Noni Saupe, MD  Cardiologist:  Gypsy Balsam, MD    Referring MD: Noni Saupe, MD   Chief Complaint  Patient presents with   Follow-up  I was in the hospital  History of Present Illness:    Jerome Phelps is a 61 y.o. male with past medical history significant for obstructive sleep apnea, coronary artery disease, cardiac catheterization 2014 showing 20% LAD, recent cardiac catheterization done just few weeks ago showing LPA V lesion of 50%, first marginal branch 70% interestingly LVEDP was severely elevated at a level of 30 mmHg.  He was appropriately diuresed.  What prompted his cardiac catheterization was the fact he presented to [facility] with chest pain also have a spell of troponin.  However no target lesion has been identified on cardiac catheterization.  Since time of hospitalization he is doing fair.  He complained of being weak tired exhausted and short of breath.  Denies have any chest pain tightness squeezing pressure burning chest.  Past Medical History:  Diagnosis Date   Baker's cyst    Carotid artery occlusion    Hyperlipidemia    Sleep apnea    Venous insufficiency     Past Surgical History:  Procedure Laterality Date   CORONARY ANGIOPLASTY     Right/Left   KNEE SURGERY Left    LEFT HEART CATH AND CORONARY ANGIOGRAPHY N/A 10/21/2021   Procedure: LEFT HEART CATH AND CORONARY ANGIOGRAPHY;  Surgeon: Corky Crafts, MD;  Location: [facility] INVASIVE CV LAB;  Service: Cardiovascular;  Laterality: N/A;    Current Medications: Current Meds  Medication Sig   aspirin EC 81 MG tablet Take 1 tablet (81 mg total) by mouth daily. Swallow whole.   carvedilol (COREG) 6.25 MG tablet Take 1 tablet (6.25 mg total) by mouth 2 (two) times daily with a meal.   clopidogrel (PLAVIX) 75 MG tablet Take 1 tablet (75 mg total) by mouth daily.    Fexofenadine-Pseudoephedrine (ALLEGRA-D 24 HOUR PO) Take 1 tablet by mouth daily as needed (for allergies).   furosemide (LASIX) 40 MG tablet Take 1 tablet (40 mg total) by mouth daily.   losartan (COZAAR) 25 MG tablet Take 1 tablet (25 mg total) by mouth daily.   meloxicam (MOBIC) 15 MG tablet Take 15 mg by mouth daily.   multivitamin (ONE-A-DAY MEN'S) TABS tablet Take 1 tablet by mouth daily with breakfast.   potassium chloride (KLOR-CON) 10 MEQ tablet Take 1 tablet (10 mEq total) by mouth daily.   rosuvastatin (CRESTOR) 20 MG tablet Take 1 tablet (20 mg total) by mouth daily.   [DISCONTINUED] albuterol (VENTOLIN HFA) 108 (90 Base) MCG/ACT inhaler Inhale 2 puffs into the lungs every 6 (six) hours as needed for wheezing or shortness of breath.   [DISCONTINUED] traMADol (ULTRAM) 50 MG tablet Take 50 mg by mouth every 6 (six) hours as needed (for pain).     Allergies:   Atorvastatin   Social History   Socioeconomic History   Marital status: Married    Spouse name: Not on file   Number of children: 1   Years of education: Not on file   Highest education level: Not on file  Occupational History   Not on file  Tobacco Use   Smoking status: Never   Smokeless tobacco: Never  Vaping Use   Vaping Use: Never used  Substance and Sexual Activity  Alcohol use: No   Drug use: No   Sexual activity: Not on file  Other Topics Concern   Not on file  Social History Narrative   Not on file   Social Determinants of Health   Financial Resource Strain: Not on file  Food Insecurity: Not on file  Transportation Needs: Not on file  Physical Activity: Not on file  Stress: Not on file  Social Connections: Not on file     Family History: The patient's family history includes Lung cancer in his father; Thyroid cancer in his mother. ROS:   Please see the history of present illness.    All 14 point review of systems negative except as described per history of present illness  EKGs/Labs/Other  Studies Reviewed:   Cardiac catheterization from 10/21/2021   LPAV lesion is 50% stenosed.   1st Mrg lesion is 70% stenosed.   LV end diastolic pressure is severely elevated.  LVEDP 30 mmHg.   The left ventricular ejection fraction is 55-65% by visual estimate.   There is no aortic valve stenosis.   Moderate, diffuse coronary disease noted throughout the left system, most prominently in the LAD.   Mildly elevated troponin done at [facility].  No obvious culprit lesion.  His LVEDP is very high.  He has some diffuse, branch vessel lesions.  This may have been demand ischemia, which is consistent with his normal EF noted by echo and by cath.  Diurese aggressively and try to optimize medications.  He will need aggressive secondary prevention including statin therapy.  Clopidogrel also started in the Cath Lab due to elevated troponin.  Recent Labs: 09/15/2021: Pro B Natriuretic peptide (BNP) 34.0; TSH 1.86 10/23/2021: Hemoglobin 15.4; Platelets 179 10/28/2021: ALT 18 11/07/2021: BUN 16; Creatinine, Ser 1.18; Potassium 4.4; Sodium 139  Recent Lipid Panel    Component Value Date/Time   CHOL 120 10/28/2021 1010   TRIG 131 10/28/2021 1010   HDL 38 (L) 10/28/2021 1010   CHOLHDL 3.2 10/28/2021 1010   CHOLHDL 3.8 10/23/2021 0930   VLDL 31 10/23/2021 0930   LDLCALC 59 10/28/2021 1010    Physical Exam:    VS:  BP 122/76 (BP Location: Left Arm, Patient Position: Sitting)   Pulse 64   Ht 6\' 1"  (1.854 m)   Wt (!) 357 lb 9.6 oz (162.2 kg)   SpO2 92%   BMI 47.18 kg/m     Wt Readings from Last 3 Encounters:  12/06/21 (!) 357 lb 9.6 oz (162.2 kg)  10/23/21 (!) 350 lb (158.8 kg)  09/15/21 (!) 361 lb (163.7 kg)     GEN:  Well nourished, well developed in no acute distress HEENT: Normal NECK: No JVD; No carotid bruits LYMPHATICS: No lymphadenopathy CARDIAC: RRR, no murmurs, no rubs, no gallops RESPIRATORY:  Clear to auscultation without rales, wheezing or rhonchi  ABDOMEN: Soft,  non-tender, non-distended MUSCULOSKELETAL:  No edema; No deformity  SKIN: Warm and dry LOWER EXTREMITIES: no swelling NEUROLOGIC:  Alert and oriented x 3 PSYCHIATRIC:  Normal affect   ASSESSMENT:    1. Coronary artery disease involving native coronary artery of native heart without angina pectoris   2. Essential hypertension   3. NSTEMI (non-ST elevated myocardial infarction) (HCC)   4. Dyslipidemia   5. Morbid obesity (HCC)    PLAN:    In order of problems listed above:  Coronary disease.  He is on dual antiplatelet therapy, chronic catheterization reviewed with the patient, no target lesion identified for intervention.  We will  continue antiplatelets therapy we will continue statin. Dyspnea on exertion.  Probably diastolic dysfunction.  He is also scheduled to have pulmonary function test by pulmonary which will be beneficial to normal.  In the meantime he is taking Lasix 40.  I will check his proBNP and Chem-7 today if that is fine we will probably increase dose of diuretic.  We will also consider adding Farxiga History of non-STEMI again with chart reviewed with the patient.  Continue present management Dyslipidemia he is on statin which I will continue.  I did review K PN which show me his LDL 59 HDL 38. I reviewed record from the hospital for this visit.  Side of cardiac catheterization the right wrist feeling well   Medication Adjustments/Labs and Tests Ordered: Current medicines are reviewed at length with the patient today.  Concerns regarding medicines are outlined above.  No orders of the defined types were placed in this encounter.  Medication changes: No orders of the defined types were placed in this encounter.   Signed, Georgeanna Lea, MD, [facility] 12/06/2021 9:14 AM    [facility]

## 2021-12-06 NOTE — Patient Instructions (Signed)
Medication Instructions:  Your physician recommends that you continue on your current medications as directed. Please refer to the Current Medication list given to you today.  *If you need a refill on your cardiac medications before your next appointment, please call your pharmacy*   Lab Work: Your physician recommends that you return for lab work in: Today for a BMP & ProBnp  If you have labs (blood work) drawn today and your tests are completely normal, you will receive your results only by: MyChart Message (if you have MyChart) OR A paper copy in the mail If you have any lab test that is abnormal or we need to change your treatment, we will call you to review the results.   Testing/Procedures: NONE   Follow-Up: At [facility], you and your health needs are our priority.  As part of our continuing mission to provide you with exceptional heart care, we have created designated Provider Care Teams.  These Care Teams include your primary Cardiologist (physician) and Advanced Practice Providers (APPs -  Physician Assistants and Nurse Practitioners) who all work together to provide you with the care you need, when you need it.  We recommend signing up for the patient portal called "MyChart".  Sign up information is provided on this After Visit Summary.  MyChart is used to connect with patients for Virtual Visits (Telemedicine).  Patients are able to view lab/test results, encounter notes, upcoming appointments, etc.  Non-urgent messages can be sent to your provider as well.   To learn more about what you can do with MyChart, go to [email or web address].    Your next appointment:   6 week(s)  The format for your next appointment:   In Person  Provider:   Gypsy Balsam, MD    Other Instructions   Important Information About Sugar

## 2021-12-06 NOTE — Addendum Note (Signed)
Addended by: Roosvelt Harps R on: 12/06/2021 09:20 AM   Modules accepted: Orders

## 2021-12-07 LAB — BASIC METABOLIC PANEL
BUN/Creatinine Ratio: 15 (ref 10–24)
BUN: 17 mg/dL (ref 8–27)
CO2: 26 mmol/L (ref 20–29)
Calcium: 8.9 mg/dL (ref 8.6–10.2)
Chloride: 104 mmol/L (ref 96–106)
Creatinine, Ser: 1.15 mg/dL (ref 0.76–1.27)
Glucose: 98 mg/dL (ref 70–99)
Potassium: 4.8 mmol/L (ref 3.5–5.2)
Sodium: 141 mmol/L (ref 134–144)
eGFR: 72 mL/min/{1.73_m2} (ref >59)

## 2021-12-07 LAB — PRO B NATRIURETIC PEPTIDE: NT-Pro BNP: 113 pg/mL (ref 0–210)

## 2021-12-15 ENCOUNTER — Telehealth: Payer: Self-pay

## 2021-12-15 NOTE — Telephone Encounter (Signed)
Patient notified of results.

## 2021-12-15 NOTE — Telephone Encounter (Signed)
-----   Message from Georgeanna Lea, MD sent at 12/09/2021  9:54 AM EDT ----- His lumbar chest fine, there is no evidence of CHF.  Probably his symptomatology is related to lung condition, awaiting PFTs

## 2021-12-30 ENCOUNTER — Telehealth: Payer: Self-pay

## 2021-12-30 ENCOUNTER — Other Ambulatory Visit: Payer: Self-pay

## 2021-12-30 ENCOUNTER — Other Ambulatory Visit: Payer: Self-pay | Admitting: Physician Assistant

## 2021-12-30 MED ORDER — FUROSEMIDE 40 MG PO TABS: 40.0000 mg | ORAL_TABLET | Freq: Every day | ORAL | 1 refills | Status: DC

## 2021-12-30 NOTE — Telephone Encounter (Signed)
-----   Message from Georgeanna Lea, MD sent at 12/30/2021 12:54 PM EDT ----- Regarding: RE: Medication refill the reason why I did not increase Lasix is the fact that proBNP is normal, so lets keep things unchanged ----- Message ----- From: Heywood Bene, CMA Sent: 12/30/2021  12:18 PM EDT To: Georgeanna Lea, MD Subject: Medication refill                              Please see BMP results. On your note you've mention may increase Lasix depending on lab results. Please advise

## 2021-12-30 NOTE — Telephone Encounter (Signed)
Rx sent 

## 2022-01-01 NOTE — Progress Notes (Unsigned)
Synopsis: Referred for DOE by Noni Saupe, MD  Subjective:   PATIENT ID: Dashawn Hallam GENDER: male DOB: 10/31/1960, MRN: [id number]  No chief complaint on file.  61yM with history of OSA - on CPAP uses every night, nCAD, severe obesity   2 years ago had left knee replaced and lost a lot of weight. Felt good at the time and after gaining it back he's had trouble.  DOE with activity over the last 6 months. Notices it especially after coming out of shower. Has to take a break after walking to mailbox and back. No orthopnea.   Dry cough in the morning and not really bothersome.   Father had black lung. Mother had thyroid cancer.   He never smoked. He is a Naval architect.   Interval HPI:  Started on prn albuterol. Lab workup unrevealing for cause of dyspnea. PFTs   Had LHC at [facility] 10/21/21 with OM1 70% stenosis, LPAV 50% stenosis and LVEDP of 30!  Otherwise pertinent review of systems is negative.   Past Medical History:  Diagnosis Date   Baker's cyst    Carotid artery occlusion    Hyperlipidemia    Sleep apnea    Venous insufficiency      Family History  Problem Relation Age of Onset   Thyroid cancer Mother    Lung cancer Father      Past Surgical History:  Procedure Laterality Date   CORONARY ANGIOPLASTY     Right/Left   KNEE SURGERY Left    LEFT HEART CATH AND CORONARY ANGIOGRAPHY N/A 10/21/2021   Procedure: LEFT HEART CATH AND CORONARY ANGIOGRAPHY;  Surgeon: Corky Crafts, MD;  Location: [facility] IN[facility];  Service: Cardiovascular;  Laterality: N/A;    Social History   Socioeconomic History   Marital status: Married    Spouse name: Not on file   Number of children: 1   Years of education: Not on file   Highest education level: Not on file  Occupational History   Not on file  Tobacco Use   Smoking status: Never   Smokeless tobacco: Never  Vaping Use   Vaping Use: Never used  Substance and Sexual Activity   Alcohol use: No   Drug  use: No   Sexual activity: Not on file  Other Topics Concern   Not on file  Social History Narrative   Not on file   Social Determinants of Health   Financial Resource Strain: Not on file  Food Insecurity: Not on file  Transportation Needs: Not on file  Physical Activity: Not on file  Stress: Not on file  Social Connections: Not on file  Intimate Partner Violence: Not on file     Allergies  Allergen Reactions   Atorvastatin Other (See Comments)    Muscle aches and made the legs sore     Outpatient Medications Prior to Visit  Medication Sig Dispense Refill   aspirin EC 81 MG tablet Take 1 tablet (81 mg total) by mouth daily. Swallow whole.     carvedilol (COREG) 6.25 MG tablet Take 1 tablet (6.25 mg total) by mouth 2 (two) times daily with a meal. 90 tablet 3   clopidogrel (PLAVIX) 75 MG tablet Take 1 tablet (75 mg total) by mouth daily. 90 tablet 3   Fexofenadine-Pseudoephedrine (ALLEGRA-D 24 HOUR PO) Take 1 tablet by mouth daily as needed (for allergies).     furosemide (LASIX) 40 MG tablet Take 1 tablet (40 mg total) by mouth daily.  90 tablet 1   losartan (COZAAR) 25 MG tablet Take 1 tablet (25 mg total) by mouth daily. 90 tablet 1   meloxicam (MOBIC) 15 MG tablet Take 15 mg by mouth daily.     multivitamin (ONE-A-DAY MEN'S) TABS tablet Take 1 tablet by mouth daily with breakfast.     potassium chloride (KLOR-CON) 10 MEQ tablet Take 1 tablet (10 mEq total) by mouth daily. 30 tablet 1   rosuvastatin (CRESTOR) 20 MG tablet Take 1 tablet (20 mg total) by mouth daily. 90 tablet 3   No facility-administered medications prior to visit.       Objective:   Physical Exam:  General appearance: 61 y.o., male, NAD, conversant  Eyes: anicteric sclerae; PERRL, tracking appropriately HENT: NCAT; MMM Neck: Trachea midline; no lymphadenopathy, no JVD Lungs: CTAB, no crackles, no wheeze, with normal respiratory effort CV: RRR, no murmur  Abdomen: Soft, non-tender; non-distended,  BS present  Extremities: No peripheral edema, warm Skin: Normal turgor and texture; no rash Psych: Appropriate affect Neuro: Alert and oriented to person and place, no focal deficit     There were no vitals filed for this visit.    on RA BMI Readings from Last 3 Encounters:  12/06/21 47.18 kg/m  10/23/21 46.18 kg/m  09/15/21 47.63 kg/m   Wt Readings from Last 3 Encounters:  12/06/21 (!) 357 lb 9.6 oz (162.2 kg)  10/23/21 (!) 350 lb (158.8 kg)  09/15/21 (!) 361 lb (163.7 kg)     CBC    Component Value Date/Time   WBC 9.1 10/23/2021 0930   RBC 5.19 10/23/2021 0930   HGB 15.4 10/23/2021 0930   HCT 44.8 10/23/2021 0930   PLT 179 10/23/2021 0930   MCV 86.3 10/23/2021 0930   MCH 29.7 10/23/2021 0930   MCHC 34.4 10/23/2021 0930   RDW 14.3 10/23/2021 0930   LYMPHSABS 1.5 09/15/2021 1606   MONOABS 0.9 09/15/2021 1606   EOSABS 0.2 09/15/2021 1606   BASOSABS 0.0 09/15/2021 1606      Chest Imaging: CXR reviewed by me today with coarsened interstitium awaiting final read  Pulmonary Functions Testing Results:     No data to display            Echocardiogram:   TTE 04/15/21:  1. Left ventricular ejection fraction, by estimation, is 50 to 55%. The  left ventricle has low normal function. Left ventricular endocardial  border not optimally defined to evaluate regional wall motion. There is  moderate concentric left ventricular  hypertrophy. Left ventricular diastolic parameters are consistent with  Grade I diastolic dysfunction (impaired relaxation).   2. Right ventricular systolic function is normal. The right ventricular  size is normal. Tricuspid regurgitation signal is inadequate for assessing  PA pressure.   3. The mitral valve is normal in structure. No evidence of mitral valve  regurgitation. No evidence of mitral stenosis.   4. The aortic valve is tricuspid. Aortic valve regurgitation is not  visualized. No aortic stenosis is present.   5. There is  moderate dilatation of the ascending aorta, measuring 40 mm.   Stress test 06/2019: Low risk, normal  LHC 10/21/21:   LPAV lesion is 50% stenosed.   1st Mrg lesion is 70% stenosed.   LV end diastolic pressure is severely elevated.  LVEDP 30 mmHg.   The left ventricular ejection fraction is 55-65% by visual estimate.   There is no aortic valve stenosis.   Moderate, diffuse coronary disease noted throughout the left system, most prominently in  the LAD.   Mildly elevated troponin done at [facility].  No obvious culprit lesion.  His LVEDP is very high.  He has some diffuse, branch vessel lesions.  This may have been demand ischemia, which is consistent with his normal EF noted by echo and by cath.  Diurese aggressively and try to optimize medications.  He will need aggressive secondary prevention including statin therapy.  Clopidogrel also started in the Cath Lab due to elevated troponin.    Assessment & Plan:   # DOE Unclear etiology - he wonders if it's primarily due to weight and deconditioning. Alternatively consider undertreated OSA (has had issues with new device), diastolic dysfunction but looks well compensated, angina but had normal stress 06/2019. Little by history to suggest primary pulmonary airways or parenchymal issue other than a reported history of allergy.   Plan: - BNP, CBC, TSH - PFTs next visit - to bring copy of CPAP download to next visit - trial albuterol prn   RTC 6 weeks with PFT  Omar Person, MD [facility] 01/01/2022 6:01 PM

## 2022-01-03 NOTE — Telephone Encounter (Signed)
LM to return my call. 

## 2022-01-04 ENCOUNTER — Encounter: Payer: Self-pay | Admitting: Student

## 2022-01-04 ENCOUNTER — Ambulatory Visit (INDEPENDENT_AMBULATORY_CARE_PROVIDER_SITE_OTHER): Payer: 59 | Admitting: Student

## 2022-01-04 ENCOUNTER — Ambulatory Visit: Payer: 59 | Admitting: Student

## 2022-01-04 VITALS — BP 112/70 | HR 69 | Temp 98.3°F | Ht 73.0 in | Wt 357.0 lb

## 2022-01-04 DIAGNOSIS — R0609 Other forms of dyspnea: Secondary | ICD-10-CM | POA: Diagnosis not present

## 2022-01-04 DIAGNOSIS — G4733 Obstructive sleep apnea (adult) (pediatric): Secondary | ICD-10-CM | POA: Diagnosis not present

## 2022-01-04 DIAGNOSIS — Z9989 Dependence on other enabling machines and devices: Secondary | ICD-10-CM

## 2022-01-04 LAB — PULMONARY FUNCTION TEST
DL/VA % pred: 90 %
DL/VA: 3.73 ml/min/mmHg/L
DLCO cor % pred: 79 %
DLCO cor: 23.61 ml/min/mmHg
DLCO unc % pred: 76 %
DLCO unc: 22.77 ml/min/mmHg
FEF 25-75 Post: 3.12 L/sec
FEF 25-75 Pre: 2.49 L/sec
FEF2575-%Change-Post: 25 %
FEF2575-%Pred-Post: 100 %
FEF2575-%Pred-Pre: 79 %
FEV1-%Change-Post: 6 %
FEV1-%Pred-Post: 84 %
FEV1-%Pred-Pre: 80 %
FEV1-Post: 3.31 L
FEV1-Pre: 3.12 L
FEV1FVC-%Change-Post: 0 %
FEV1FVC-%Pred-Pre: 100 %
FEV6-%Change-Post: 6 %
FEV6-%Pred-Post: 88 %
FEV6-%Pred-Pre: 83 %
FEV6-Post: 4.39 L
FEV6-Pre: 4.14 L
FEV6FVC-%Change-Post: 0 %
FEV6FVC-%Pred-Post: 104 %
FEV6FVC-%Pred-Pre: 104 %
FVC-%Change-Post: 6 %
FVC-%Pred-Post: 85 %
FVC-%Pred-Pre: 79 %
FVC-Post: 4.42 L
FVC-Pre: 4.14 L
Post FEV1/FVC ratio: 75 %
Post FEV6/FVC ratio: 99 %
Pre FEV1/FVC ratio: 75 %
Pre FEV6/FVC Ratio: 100 %
RV % pred: 119 %
RV: 2.96 L
TLC % pred: 98 %
TLC: 7.48 L

## 2022-01-04 NOTE — Patient Instructions (Signed)
Full PFT performed today. °

## 2022-01-04 NOTE — Progress Notes (Signed)
Full PFT performed today. °

## 2022-01-04 NOTE — Patient Instructions (Addendum)
-   we will order overnight oximetry - you may need  - head to [facility] for arterial blood gas to check CO2 level - see you in a month or sooner if need be. If overnight oximetry is not yet done before next appointment then postpone next clinic appointment - weigh yourself daily, if 3lb increase in a day or 5 lb increase in a week then take lasix 40 mg twice daily till you're back down to 357 lb. If you find you're having to take lasix twice daily for more than 5-7 days then let your PCP or cardiologist know because you'll need new bloodwork to check electrolytes and kidney function.

## 2022-01-06 ENCOUNTER — Other Ambulatory Visit: Payer: Self-pay | Admitting: Physician Assistant

## 2022-01-11 ENCOUNTER — Other Ambulatory Visit: Payer: Self-pay | Admitting: Student

## 2022-01-11 DIAGNOSIS — G4733 Obstructive sleep apnea (adult) (pediatric): Secondary | ICD-10-CM

## 2022-01-19 ENCOUNTER — Ambulatory Visit: Payer: 59 | Attending: Cardiology | Admitting: Cardiology

## 2022-01-19 ENCOUNTER — Encounter: Payer: Self-pay | Admitting: Cardiology

## 2022-01-19 VITALS — BP 130/74 | HR 77 | Ht 73.0 in | Wt 359.0 lb

## 2022-01-19 DIAGNOSIS — I2510 Atherosclerotic heart disease of native coronary artery without angina pectoris: Secondary | ICD-10-CM | POA: Diagnosis not present

## 2022-01-19 DIAGNOSIS — R0609 Other forms of dyspnea: Secondary | ICD-10-CM | POA: Diagnosis not present

## 2022-01-19 DIAGNOSIS — G4733 Obstructive sleep apnea (adult) (pediatric): Secondary | ICD-10-CM | POA: Diagnosis not present

## 2022-01-19 DIAGNOSIS — I10 Essential (primary) hypertension: Secondary | ICD-10-CM

## 2022-01-19 NOTE — Progress Notes (Signed)
Cardiology Office Note:    Date:  01/19/2022   ID:  Jerome Phelps, DOB 1961/01/09, MRN [id number]  PCP:  Noni Saupe, MD  Cardiologist:  Gypsy Balsam, MD    Referring MD: Noni Saupe, MD   Chief Complaint  Patient presents with   Follow-up  Still having some shortness of breath  History of Present Illness:    Jerome Phelps is a 60 y.o. male with past medical history significant for morbid obesity, obstructive sleep apnea, coronary artery disease, cardiac catheterization done just in June of this year showed 50% L PLV branch, 70% first marginal branch interestingly LVEDP was severely elevated of 30 mmHg, left ventricle ejection fraction was normal.  He was appropriately diuresed.  He said he is feeling better but still shortness of breath is present he also see pulmonary doctor which is very beneficial.  There are some issue with his CPAP mask apparently needs to be adjusted that is what we are waiting for.  Swelling of lower extremities is minimal.  Denies have any chest pain tightness squeezing pressure burning chest.  Past Medical History:  Diagnosis Date   Baker's cyst    Carotid artery occlusion    Hyperlipidemia    Sleep apnea    Venous insufficiency     Past Surgical History:  Procedure Laterality Date   CORONARY ANGIOPLASTY     Right/Left   KNEE SURGERY Left    LEFT HEART CATH AND CORONARY ANGIOGRAPHY N/A 10/21/2021   Procedure: LEFT HEART CATH AND CORONARY ANGIOGRAPHY;  Surgeon: Corky Crafts, MD;  Location: [facility] INVASIVE CV LAB;  Service: Cardiovascular;  Laterality: N/A;    Current Medications: Current Meds  Medication Sig   aspirin EC 81 MG tablet Take 1 tablet (81 mg total) by mouth daily. Swallow whole.   carvedilol (COREG) 6.25 MG tablet Take 1 tablet (6.25 mg total) by mouth 2 (two) times daily with a meal.   clopidogrel (PLAVIX) 75 MG tablet Take 1 tablet (75 mg total) by mouth daily.   Fexofenadine-Pseudoephedrine (ALLEGRA-D 24 HOUR PO)  Take 1 tablet by mouth daily as needed (for allergies).   furosemide (LASIX) 40 MG tablet Take 1 tablet (40 mg total) by mouth daily.   losartan (COZAAR) 25 MG tablet Take 1 tablet (25 mg total) by mouth daily.   meloxicam (MOBIC) 15 MG tablet Take 15 mg by mouth daily.   multivitamin (ONE-A-DAY MEN'S) TABS tablet Take 1 tablet by mouth daily with breakfast.   potassium chloride (KLOR-CON M) 10 MEQ tablet TAKE ONE TABLET BY MOUTH EVERY DAY   potassium chloride (KLOR-CON) 10 MEQ tablet Take 1 tablet (10 mEq total) by mouth daily.   rosuvastatin (CRESTOR) 20 MG tablet Take 1 tablet (20 mg total) by mouth daily.     Allergies:   Atorvastatin   Social History   Socioeconomic History   Marital status: Married    Spouse name: Not on file   Number of children: 1   Years of education: Not on file   Highest education level: Not on file  Occupational History   Not on file  Tobacco Use   Smoking status: Never   Smokeless tobacco: Never  Vaping Use   Vaping Use: Never used  Substance and Sexual Activity   Alcohol use: No   Drug use: No   Sexual activity: Not on file  Other Topics Concern   Not on file  Social History Narrative   Not on file   Social  Determinants of Health   Financial Resource Strain: Not on file  Food Insecurity: Not on file  Transportation Needs: Not on file  Physical Activity: Not on file  Stress: Not on file  Social Connections: Not on file     Family History: The patient's family history includes Lung cancer in his father; Thyroid cancer in his mother. ROS:   Please see the history of present illness.    All 14 point review of systems negative except as described per history of present illness  EKGs/Labs/Other Studies Reviewed:      Recent Labs: 09/15/2021: TSH 1.86 10/23/2021: Hemoglobin 15.4; Platelets 179 10/28/2021: ALT 18 12/06/2021: BUN 17; Creatinine, Ser 1.15; NT-Pro BNP 113; Potassium 4.8; Sodium 141  Recent Lipid Panel    Component Value  Date/Time   CHOL 120 10/28/2021 1010   TRIG 131 10/28/2021 1010   HDL 38 (L) 10/28/2021 1010   CHOLHDL 3.2 10/28/2021 1010   CHOLHDL 3.8 10/23/2021 0930   VLDL 31 10/23/2021 0930   LDLCALC 59 10/28/2021 1010    Physical Exam:    VS:  BP 130/74 (BP Location: Left Arm, Patient Position: Sitting)   Pulse 77   Ht 6\' 1"  (1.854 m)   Wt (!) 359 lb (162.8 kg)   SpO2 90%   BMI 47.36 kg/m     Wt Readings from Last 3 Encounters:  01/19/22 (!) 359 lb (162.8 kg)  01/04/22 (!) 357 lb (161.9 kg)  12/06/21 (!) 357 lb 9.6 oz (162.2 kg)     GEN:  Well nourished, well developed in no acute distress HEENT: Normal NECK: No JVD; No carotid bruits LYMPHATICS: No lymphadenopathy CARDIAC: RRR, no murmurs, no rubs, no gallops RESPIRATORY:  Clear to auscultation without rales, wheezing or rhonchi  ABDOMEN: Soft, non-tender, non-distended MUSCULOSKELETAL:  No edema; No deformity  SKIN: Warm and dry LOWER EXTREMITIES: Minimal swelling NEUROLOGIC:  Alert and oriented x 3 PSYCHIATRIC:  Normal affect   ASSESSMENT:    1. Dyspnea on exertion   2. Coronary artery disease involving native coronary artery of native heart without angina pectoris   3. Essential hypertension   4. Obstructive sleep apnea syndrome    PLAN:    In order of problems listed above:  Dyspnea exertion multifactorial obesity, pulmonary issue as well as diastolic dysfunction play significant role here.  I think I will be able to increase the dose of diuretic but I want to check Chem-7 and proBNP and based on that decide about potentially increasing it.  In the meantime continue present management Coronary disease which is mild to moderate.  No target lesion for intervention.  We will continue present medications which include antiplatelets therapy. Dyslipidemia I did review his K PN which show me data from June 2023 with LDL 59 HDL 38 there is a good cholesterol profile we will continue present management.   Medication  Adjustments/Labs and Tests Ordered: Current medicines are reviewed at length with the patient today.  Concerns regarding medicines are outlined above.  Orders Placed This Encounter  Procedures   Basic metabolic panel   Pro b natriuretic peptide (BNP)   Medication changes: No orders of the defined types were placed in this encounter.   Signed, July 2023, MD, [place] 01/19/2022 8:37 AM    [facility]

## 2022-01-19 NOTE — Patient Instructions (Signed)
Medication Instructions:  Your physician recommends that you continue on your current medications as directed. Please refer to the Current Medication list given to you today.  *If you need a refill on your cardiac medications before your next appointment, please call your pharmacy*   Lab Work: Pro BNP, BMP- today If you have labs (blood work) drawn today and your tests are completely normal, you will receive your results only by: MyChart Message (if you have MyChart) OR A paper copy in the mail If you have any lab test that is abnormal or we need to change your treatment, we will call you to review the results.   Testing/Procedures: None Ordered   Follow-Up: At [facility], you and your health needs are our priority.  As part of our continuing mission to provide you with exceptional heart care, we have created designated Provider Care Teams.  These Care Teams include your primary Cardiologist (physician) and Advanced Practice Providers (APPs -  Physician Assistants and Nurse Practitioners) who all work together to provide you with the care you need, when you need it.  We recommend signing up for the patient portal called "MyChart".  Sign up information is provided on this After Visit Summary.  MyChart is used to connect with patients for Virtual Visits (Telemedicine).  Patients are able to view lab/test results, encounter notes, upcoming appointments, etc.  Non-urgent messages can be sent to your provider as well.   To learn more about what you can do with MyChart, go to [email or web address].    Your next appointment:   3 month(s)  The format for your next appointment:   In Person  Provider:   Gypsy Balsam, MD    Other Instructions NA

## 2022-01-20 ENCOUNTER — Ambulatory Visit (HOSPITAL_COMMUNITY)
Admission: RE | Admit: 2022-01-20 | Discharge: 2022-01-20 | Disposition: A | Discharge status: Home / Self Care | Payer: 59 | Source: Ambulatory Visit | Attending: Student | Admitting: Student

## 2022-01-20 DIAGNOSIS — I5031 Acute diastolic (congestive) heart failure: Secondary | ICD-10-CM | POA: Diagnosis present

## 2022-01-20 DIAGNOSIS — R0609 Other forms of dyspnea: Secondary | ICD-10-CM | POA: Diagnosis present

## 2022-01-20 LAB — BLOOD GAS, ARTERIAL
Acid-base deficit: 2.2 mmol/L — ABNORMAL HIGH (ref 0.0–2.0)
Bicarbonate: 22.4 mmol/L (ref 20.0–28.0)
O2 Saturation: 97.4 %
Patient temperature: 37
pCO2 arterial: 37 mmHg (ref 32–48)
pH, Arterial: 7.39 (ref 7.35–7.45)
pO2, Arterial: 87 mmHg (ref 83–108)

## 2022-01-20 LAB — BASIC METABOLIC PANEL
BUN/Creatinine Ratio: 11 (ref 10–24)
BUN: 16 mg/dL (ref 8–27)
CO2: 23 mmol/L (ref 20–29)
Calcium: 8.4 mg/dL — ABNORMAL LOW (ref 8.6–10.2)
Chloride: 103 mmol/L (ref 96–106)
Creatinine, Ser: 1.42 mg/dL — ABNORMAL HIGH (ref 0.76–1.27)
Glucose: 101 mg/dL — ABNORMAL HIGH (ref 70–99)
Potassium: 4.5 mmol/L (ref 3.5–5.2)
Sodium: 140 mmol/L (ref 134–144)
eGFR: 56 mL/min/{1.73_m2} — ABNORMAL LOW (ref >59)

## 2022-01-20 LAB — PRO B NATRIURETIC PEPTIDE: NT-Pro BNP: 86 pg/mL (ref 0–210)

## 2022-01-20 NOTE — Progress Notes (Signed)
Patient in today for ABG on RA.  ABG done and results in computer

## 2022-02-02 ENCOUNTER — Telehealth: Payer: Self-pay

## 2022-02-02 NOTE — Telephone Encounter (Signed)
-----   Message from Park Liter, MD sent at 01/27/2022  9:31 AM EDT ----- Blood test did not show any evidence of CHF.  Continue present management for now

## 2022-02-02 NOTE — Telephone Encounter (Signed)
Patient notified of results.

## 2022-02-06 NOTE — Telephone Encounter (Signed)
error 

## 2022-02-14 NOTE — Progress Notes (Signed)
Synopsis: Referred for DOE by Angelina Sheriff, MD  Subjective:   PATIENT ID: Jerome Phelps GENDER: male DOB: [birth year], MRN: [id number]  Chief Complaint  Patient presents with   Follow-up    Breathing   61yM with history of OSA - on CPAP uses every night, nCAD, severe obesity   2 years ago had left knee replaced and lost a lot of weight. Felt good at the time and after gaining it back he's had trouble.  DOE with activity over the last 6 months. Notices it especially after coming out of shower. Has to take a break after walking to mailbox and back. No orthopnea.   Dry cough in the morning and not really bothersome.   Father had black lung. Mother had thyroid cancer.   He never smoked. He is a Administrator.   Interval HPI:  Started on prn albuterol. Lab workup unrevealing for cause of dyspnea. PFTs   Had LHC at [facility] 10/21/21 with OM1 70% stenosis, LPAV 50% stenosis and LVEDP of 30! He was started on dapt at time of cath, restarted on crestor. When he tried increasing his lasix to 40 mg twice daily he didn't find it helpful for his DOE. Today his weight is exactly the same as when he had LHC (357).  He says he'd doing ok. Hasn't improved from DOE standpoint since last visit.  Although his download from CPAP looks excellent with >95% adherence with use typically 6-8 hours each night with residual AHI only ~0.2, leak acceptable, he finds his sleep is less restful than it used to be. No PND.   Interval HPI: ABG wnl, overnight oximetry  He says he's doing ok but still has severe DOE, feels winded with light activity. Still feeling sleepy during the day. No CP. Dry cough but 'breaks up after cup of coffee.'  Albuterol remains unhelpful for his DOE.   Otherwise pertinent review of systems is negative.   Past Medical History:  Diagnosis Date   Baker's cyst    Carotid artery occlusion    Hyperlipidemia    Sleep apnea    Venous insufficiency      Family History   Problem Relation Age of Onset   Thyroid cancer Mother    Lung cancer Father      Past Surgical History:  Procedure Laterality Date   CORONARY ANGIOPLASTY     Right/Left   KNEE SURGERY Left    LEFT HEART CATH AND CORONARY ANGIOGRAPHY N/A 10/21/2021   Procedure: LEFT HEART CATH AND CORONARY ANGIOGRAPHY;  Surgeon: Jettie Booze, MD;  Location: [place] [facility];  Service: Cardiovascular;  Laterality: N/A;    Social History   Socioeconomic History   Marital status: Married    Spouse name: Not on file   Number of children: 1   Years of education: Not on file   Highest education level: Not on file  Occupational History   Not on file  Tobacco Use   Smoking status: Never   Smokeless tobacco: Never  Vaping Use   Vaping Use: Never used  Substance and Sexual Activity   Alcohol use: No   Drug use: No   Sexual activity: Not on file  Other Topics Concern   Not on file  Social History Narrative   Not on file   Social Determinants of Health   Financial Resource Strain: Not on file  Food Insecurity: Not on file  Transportation Needs: Not on file  Physical Activity: Not on file  Stress: Not on file  Social Connections: Not on file  Intimate Partner Violence: Not on file     Allergies  Allergen Reactions   Atorvastatin Other (See Comments)    Muscle aches and made the legs sore     Outpatient Medications Prior to Visit  Medication Sig Dispense Refill   Fexofenadine-Pseudoephedrine (ALLEGRA-D 24 HOUR PO) Take 1 tablet by mouth daily as needed (for allergies).     aspirin EC 81 MG tablet Take 1 tablet (81 mg total) by mouth daily. Swallow whole.     carvedilol (COREG) 6.25 MG tablet Take 1 tablet (6.25 mg total) by mouth 2 (two) times daily with a meal. 90 tablet 3   clopidogrel (PLAVIX) 75 MG tablet Take 1 tablet (75 mg total) by mouth daily. 90 tablet 3   furosemide (LASIX) 40 MG tablet Take 1 tablet (40 mg total) by mouth daily. 90 tablet 1   losartan (COZAAR) 25  MG tablet Take 1 tablet (25 mg total) by mouth daily. 90 tablet 1   meloxicam (MOBIC) 15 MG tablet Take 15 mg by mouth daily.     multivitamin (ONE-A-DAY MEN'S) TABS tablet Take 1 tablet by mouth daily with breakfast.     potassium chloride (KLOR-CON M) 10 MEQ tablet TAKE ONE TABLET BY MOUTH EVERY DAY 30 tablet 6   potassium chloride (KLOR-CON) 10 MEQ tablet Take 1 tablet (10 mEq total) by mouth daily. 30 tablet 1   rosuvastatin (CRESTOR) 20 MG tablet Take 1 tablet (20 mg total) by mouth daily. 90 tablet 3   No facility-administered medications prior to visit.       Objective:   Physical Exam:  General appearance: 61 y.o., male, NAD, conversant  Eyes: anicteric sclerae; PERRL, tracking appropriately HENT: NCAT; MMM Neck: Trachea midline; no lymphadenopathy, no JVD Lungs: CTAB, no crackles, no wheeze, with normal respiratory effort CV: RRR, no murmur  Abdomen: Soft, non-tender; non-distended, BS present  Extremities: 1+ BLE peripheral edema, warm Skin: Normal turgor and texture; no rash Psych: Appropriate affect Neuro: Alert and oriented to person and place, no focal deficit     Vitals:   02/16/22 0839  BP: 120/80  Pulse: 75  SpO2: 94%  Weight: (!) 360 lb (163.3 kg)  Height: 6\' 1"  (1.854 m)     94% on RA BMI Readings from Last 3 Encounters:  02/16/22 47.50 kg/m  01/19/22 47.36 kg/m  01/04/22 47.10 kg/m   Wt Readings from Last 3 Encounters:  02/16/22 (!) 360 lb (163.3 kg)  01/19/22 (!) 359 lb (162.8 kg)  01/04/22 (!) 357 lb (161.9 kg)     CBC    Component Value Date/Time   WBC 9.1 10/23/2021 0930   RBC 5.19 10/23/2021 0930   HGB 15.4 10/23/2021 0930   HCT 44.8 10/23/2021 0930   PLT 179 10/23/2021 0930   MCV 86.3 10/23/2021 0930   MCH 29.7 10/23/2021 0930   MCHC 34.4 10/23/2021 0930   RDW 14.3 10/23/2021 0930   LYMPHSABS 1.5 09/15/2021 1606   MONOABS 0.9 09/15/2021 1606   EOSABS 0.2 09/15/2021 1606   BASOSABS 0.0 09/15/2021 1606   ABG  7.39/37 Pro BNP 86   Chest Imaging: CXR reviewed by me 09/18/21 with coarsened interstitium   Pulmonary Functions Testing Results:    Latest Ref Rng & Units 01/04/2022    8:30 AM  PFT Results  FVC-Pre L 4.14   FVC-Predicted Pre % 79   FVC-Post L 4.42   FVC-Predicted Post % 85  Pre FEV1/FVC % % 75   Post FEV1/FCV % % 75   FEV1-Pre L 3.12   FEV1-Predicted Pre % 80   FEV1-Post L 3.31   DLCO uncorrected ml/min/mmHg 22.77   DLCO UNC% % 76   DLCO corrected ml/min/mmHg 23.61   DLCO COR %Predicted % 79   DLVA Predicted % 90   TLC L 7.48   TLC % Predicted % 98   RV % Predicted % 119       Echocardiogram:   TTE 04/15/21:  1. Left ventricular ejection fraction, by estimation, is 50 to 55%. The  left ventricle has low normal function. Left ventricular endocardial  border not optimally defined to evaluate regional wall motion. There is  moderate concentric left ventricular  hypertrophy. Left ventricular diastolic parameters are consistent with  Grade I diastolic dysfunction (impaired relaxation).   2. Right ventricular systolic function is normal. The right ventricular  size is normal. Tricuspid regurgitation signal is inadequate for assessing  PA pressure.   3. The mitral valve is normal in structure. No evidence of mitral valve  regurgitation. No evidence of mitral stenosis.   4. The aortic valve is tricuspid. Aortic valve regurgitation is not  visualized. No aortic stenosis is present.   5. There is moderate dilatation of the ascending aorta, measuring 40 mm.   Stress test 06/2019: Low risk, normal  LHC 10/21/21:   LPAV lesion is 50% stenosed.   1st Mrg lesion is 70% stenosed.   LV end diastolic pressure is severely elevated.  LVEDP 30 mmHg.   The left ventricular ejection fraction is 55-65% by visual estimate.   There is no aortic valve stenosis.   Moderate, diffuse coronary disease noted throughout the left system, most prominently in the LAD.   Mildly elevated  troponin done at [facility].  No obvious culprit lesion.  His LVEDP is very high.  He has some diffuse, branch vessel lesions.  This may have been demand ischemia, which is consistent with his normal EF noted by echo and by cath.  Diurese aggressively and try to optimize medications.  He will need aggressive secondary prevention including statin therapy.  Clopidogrel also started in the Cath Lab due to elevated troponin.    Assessment & Plan:   # DOE I still feel that major factors are probably CAD, diastolic dysfunction (recent LVEDP of 30 at weight of 357 lb), excess body weight with nonspecific ventilatory defect and low ERV. Residual daytime sleepiness despite excellent CPAP adherence and residual AHI of <1 raises question of nocturnal desaturation or superimposed OHS refractory to CPAP.  # OSA on CPAP  Plan: - still hasn't had overnight oximetry done - we are reaching out to [employer] about this - I'll send message to Dr. Bing Matter to discuss cardiopulmonary exercise testing on bike but I worry even this may be a challenge with his knee pain   RTC prn  Omar Person, MD [facility] 02/16/2022 8:42 AM

## 2022-02-16 ENCOUNTER — Ambulatory Visit (INDEPENDENT_AMBULATORY_CARE_PROVIDER_SITE_OTHER): Payer: 59 | Admitting: Student

## 2022-02-16 ENCOUNTER — Encounter: Payer: Self-pay | Admitting: Student

## 2022-02-16 VITALS — BP 120/80 | HR 75 | Ht 73.0 in | Wt 360.0 lb

## 2022-02-16 DIAGNOSIS — G4733 Obstructive sleep apnea (adult) (pediatric): Secondary | ICD-10-CM

## 2022-02-16 DIAGNOSIS — R0609 Other forms of dyspnea: Secondary | ICD-10-CM

## 2022-02-16 NOTE — Patient Instructions (Signed)
-   We will try to find overnight oximetry results - I'll send message to Dr. Agustin Cree to discuss cardiopulmonary exercise testing on bike

## 2022-02-21 ENCOUNTER — Telehealth: Payer: Self-pay

## 2022-02-21 NOTE — Telephone Encounter (Signed)
Magda Paganini, have you received the ONO results for pt? Thanks.

## 2022-02-21 NOTE — Telephone Encounter (Signed)
Pt called stating that he saw the Pulmonologist and he said all looks good with his lungs. He is wanting to see Dr. Macie Burows sooner than December. Will send message to front desk to move appt up for pt.

## 2022-02-22 NOTE — Telephone Encounter (Signed)
I have sent msg to [employer] to get them to fax over the ONO. Will await results.

## 2022-02-23 ENCOUNTER — Ambulatory Visit: Payer: 59 | Attending: Cardiology | Admitting: Cardiology

## 2022-02-23 ENCOUNTER — Encounter: Payer: Self-pay | Admitting: Cardiology

## 2022-02-23 VITALS — BP 124/70 | HR 66 | Ht 73.0 in | Wt 360.4 lb

## 2022-02-23 DIAGNOSIS — I2510 Atherosclerotic heart disease of native coronary artery without angina pectoris: Secondary | ICD-10-CM

## 2022-02-23 DIAGNOSIS — R0609 Other forms of dyspnea: Secondary | ICD-10-CM | POA: Diagnosis not present

## 2022-02-23 DIAGNOSIS — I6529 Occlusion and stenosis of unspecified carotid artery: Secondary | ICD-10-CM | POA: Diagnosis not present

## 2022-02-23 DIAGNOSIS — E785 Hyperlipidemia, unspecified: Secondary | ICD-10-CM

## 2022-02-23 DIAGNOSIS — I10 Essential (primary) hypertension: Secondary | ICD-10-CM | POA: Diagnosis not present

## 2022-02-23 DIAGNOSIS — G4733 Obstructive sleep apnea (adult) (pediatric): Secondary | ICD-10-CM

## 2022-02-23 NOTE — Progress Notes (Signed)
Cardiology Office Note:    Date:  02/23/2022   ID:  Jerome Phelps, DOB 1960/06/22, MRN [id number]  PCP:  Angelina Sheriff, MD  Cardiologist:  Jenne Campus, MD    Referring MD: Angelina Sheriff, MD   Chief Complaint  Patient presents with   Results    History of Present Illness:    Jerome Phelps is a 61 y.o. male with past medical history significant for morbid obesity, obstructive sleep apnea, coronary artery disease, cardiac catheterization done in December of this year showing 50% posterior lateral branch, 70% first obtuse marginal branch, at the time of the test LVEDP was significantly elevated however overall left ventricle ejection fraction was normal.  He still complain of having shortness of breath fatigue tiredness, but no chest pain tightness squeezing pressure burning chest.  He was followed by pulmonary for potential pulmonary explanation of his dyspnea on exertion feeling was that this is mostly related to his heart condition.  The degree of coronary artery disease I see on his cardiac catheterization is clearly not sufficient to explain it however elevation of LVEDP suggest diastolic congestive heart failure.  Interesting last time I have seen him I did proBNP on him which was perfectly normal.  I still think there is some role to intensify his diuresis to see if he feels any better.  Past Medical History:  Diagnosis Date   Baker's cyst    Carotid artery occlusion    Hyperlipidemia    Sleep apnea    Venous insufficiency     Past Surgical History:  Procedure Laterality Date   CORONARY ANGIOPLASTY     Right/Left   KNEE SURGERY Left    LEFT HEART CATH AND CORONARY ANGIOGRAPHY N/A 10/21/2021   Procedure: LEFT HEART CATH AND CORONARY ANGIOGRAPHY;  Surgeon: Jettie Booze, MD;  Location: [place] [facility];  Service: Cardiovascular;  Laterality: N/A;    Current Medications: Current Meds  Medication Sig   aspirin EC 81 MG tablet Take 1 tablet (81 mg total)  by mouth daily. Swallow whole.   carvedilol (COREG) 6.25 MG tablet Take 1 tablet (6.25 mg total) by mouth 2 (two) times daily with a meal.   clopidogrel (PLAVIX) 75 MG tablet Take 1 tablet (75 mg total) by mouth daily.   Fexofenadine-Pseudoephedrine (ALLEGRA-D 24 HOUR PO) Take 1 tablet by mouth daily as needed (for allergies).   furosemide (LASIX) 40 MG tablet Take 1 tablet (40 mg total) by mouth daily.   losartan (COZAAR) 25 MG tablet Take 1 tablet (25 mg total) by mouth daily.   meloxicam (MOBIC) 15 MG tablet Take 15 mg by mouth daily.   multivitamin (ONE-A-DAY MEN'S) TABS tablet Take 1 tablet by mouth daily with breakfast.   potassium chloride (KLOR-CON M) 10 MEQ tablet TAKE ONE TABLET BY MOUTH EVERY DAY   rosuvastatin (CRESTOR) 20 MG tablet Take 1 tablet (20 mg total) by mouth daily.   [DISCONTINUED] potassium chloride (KLOR-CON) 10 MEQ tablet Take 1 tablet (10 mEq total) by mouth daily.     Allergies:   Atorvastatin   Social History   Socioeconomic History   Marital status: Married    Spouse name: Not on file   Number of children: 1   Years of education: Not on file   Highest education level: Not on file  Occupational History   Not on file  Tobacco Use   Smoking status: Never   Smokeless tobacco: Never  Vaping Use   Vaping Use: Never  used  Substance and Sexual Activity   Alcohol use: No   Drug use: No   Sexual activity: Not on file  Other Topics Concern   Not on file  Social History Narrative   Not on file   Social Determinants of Health   Financial Resource Strain: Not on file  Food Insecurity: Not on file  Transportation Needs: Not on file  Physical Activity: Not on file  Stress: Not on file  Social Connections: Not on file     Family History: The patient's family history includes Lung cancer in his father; Thyroid cancer in his mother. ROS:   Please see the history of present illness.    All 14 point review of systems negative except as described per  history of present illness  EKGs/Labs/Other Studies Reviewed:      Recent Labs: 09/15/2021: TSH 1.86 10/23/2021: Hemoglobin 15.4; Platelets 179 10/28/2021: ALT 18 01/19/2022: BUN 16; Creatinine, Ser 1.42; NT-Pro BNP 86; Potassium 4.5; Sodium 140  Recent Lipid Panel    Component Value Date/Time   CHOL 120 10/28/2021 1010   TRIG 131 10/28/2021 1010   HDL 38 (L) 10/28/2021 1010   CHOLHDL 3.2 10/28/2021 1010   CHOLHDL 3.8 10/23/2021 0930   VLDL 31 10/23/2021 0930   LDLCALC 59 10/28/2021 1010    Physical Exam:    VS:  BP 124/70 (BP Location: Left Arm, Patient Position: Sitting)   Pulse 66   Ht 6\' 1"  (1.854 m)   Wt (!) 360 lb 6.4 oz (163.5 kg)   SpO2 96%   BMI 47.55 kg/m     Wt Readings from Last 3 Encounters:  02/23/22 (!) 360 lb 6.4 oz (163.5 kg)  02/16/22 (!) 360 lb (163.3 kg)  01/19/22 (!) 359 lb (162.8 kg)     GEN:  Well nourished, well developed in no acute distress HEENT: Normal NECK: No JVD; No carotid bruits LYMPHATICS: No lymphadenopathy CARDIAC: RRR, no murmurs, no rubs, no gallops RESPIRATORY:  Clear to auscultation without rales, wheezing or rhonchi  ABDOMEN: Soft, non-tender, non-distended MUSCULOSKELETAL:  No edema; No deformity  SKIN: Warm and dry LOWER EXTREMITIES: no swelling NEUROLOGIC:  Alert and oriented x 3 PSYCHIATRIC:  Normal affect   ASSESSMENT:    1. Essential hypertension   2. Dyspnea on exertion   3. Coronary artery disease involving native coronary artery of native heart without angina pectoris   4. Occlusion of carotid artery, unspecified laterality   5. Obstructive sleep apnea syndrome   6. Morbid obesity (HCC)   7. Dyslipidemia    PLAN:    In order of problems listed above:  Dyspnea exertion: I agree with pulmonary that this is probably multifactorial.  I do not think coronary artery disease play significant role here however diastolic dysfunction most likely does.  Therefore, I will do Chem-7 today if Chem-7 is fine I will  increase the dose of diuretics.  Additionally reason for his dyspnea on exertion is probably morbid obesity and deconditioning I encouraged him to BL be more active and see if that helps. Coronary artery disease stable from that point review.  Again the degree of narrowing that we see on cardiac catheterization not enough to explain his symptomatology.  The key is risk factors modifications he is already on Crestor which I will continue he is also on dual antiplatelet therapy which I will continue Up active sleep apnea: That being followed by internal medicine team. Peripheral vascular disease dual antiplatelet therapy and statin. Essential hypertension: Blood pressure  well controlled   Medication Adjustments/Labs and Tests Ordered: Current medicines are reviewed at length with the patient today.  Concerns regarding medicines are outlined above.  Orders Placed This Encounter  Procedures   Basic metabolic panel   Medication changes: No orders of the defined types were placed in this encounter.   Signed, Georgeanna Lea, MD, [facility] 02/23/2022 12:37 PM    [facility]

## 2022-02-23 NOTE — Patient Instructions (Signed)
Medication Instructions:  Your physician recommends that you continue on your current medications as directed. Please refer to the Current Medication list given to you today.  *If you need a refill on your cardiac medications before your next appointment, please call your pharmacy*   Lab Work: BMP- today If you have labs (blood work) drawn today and your tests are completely normal, you will receive your results only by: MyChart Message (if you have MyChart) OR A paper copy in the mail If you have any lab test that is abnormal or we need to change your treatment, we will call you to review the results.   Testing/Procedures: None Ordered   Follow-Up: At [facility], you and your health needs are our priority.  As part of our continuing mission to provide you with exceptional heart care, we have created designated Provider Care Teams.  These Care Teams include your primary Cardiologist (physician) and Advanced Practice Providers (APPs -  Physician Assistants and Nurse Practitioners) who all work together to provide you with the care you need, when you need it.  We recommend signing up for the patient portal called "MyChart".  Sign up information is provided on this After Visit Summary.  MyChart is used to connect with patients for Virtual Visits (Telemedicine).  Patients are able to view lab/test results, encounter notes, upcoming appointments, etc.  Non-urgent messages can be sent to your provider as well.   To learn more about what you can do with MyChart, go to [email or web address].    Your next appointment:   2 month(s)  The format for your next appointment:   In Person  Provider:   Robert Krasowski, MD    Other Instructions NA  

## 2022-02-24 ENCOUNTER — Telehealth: Payer: Self-pay

## 2022-02-24 DIAGNOSIS — R0609 Other forms of dyspnea: Secondary | ICD-10-CM

## 2022-02-24 DIAGNOSIS — I10 Essential (primary) hypertension: Secondary | ICD-10-CM

## 2022-02-24 LAB — BASIC METABOLIC PANEL
BUN/Creatinine Ratio: 17 (ref 10–24)
BUN: 19 mg/dL (ref 8–27)
CO2: 27 mmol/L (ref 20–29)
Calcium: 8.7 mg/dL (ref 8.6–10.2)
Chloride: 105 mmol/L (ref 96–106)
Creatinine, Ser: 1.14 mg/dL (ref 0.76–1.27)
Glucose: 103 mg/dL — ABNORMAL HIGH (ref 70–99)
Potassium: 4.4 mmol/L (ref 3.5–5.2)
Sodium: 140 mmol/L (ref 134–144)
eGFR: 72 mL/min/{1.73_m2} (ref >59)

## 2022-02-24 MED ORDER — FUROSEMIDE 40 MG PO TABS: 60.0000 mg | ORAL_TABLET | Freq: Every day | ORAL | 3 refills | Status: DC

## 2022-02-24 NOTE — Telephone Encounter (Signed)
Spoke with pt. Per Dr. Wendy Poet recommendation, Increase Lasix to 60mg  daily and continue K+. Sent change to pharmacy. Pt agreed and will come for a BMP in 1 week.

## 2022-02-28 ENCOUNTER — Encounter: Payer: Self-pay | Admitting: Student

## 2022-03-03 ENCOUNTER — Encounter: Payer: Self-pay | Admitting: Gastroenterology

## 2022-03-04 LAB — BASIC METABOLIC PANEL
BUN/Creatinine Ratio: 16 (ref 10–24)
BUN: 20 mg/dL (ref 8–27)
CO2: 25 mmol/L (ref 20–29)
Calcium: 8.8 mg/dL (ref 8.6–10.2)
Chloride: 101 mmol/L (ref 96–106)
Creatinine, Ser: 1.24 mg/dL (ref 0.76–1.27)
Glucose: 115 mg/dL — ABNORMAL HIGH (ref 70–99)
Potassium: 4.5 mmol/L (ref 3.5–5.2)
Sodium: 138 mmol/L (ref 134–144)
eGFR: 65 mL/min/{1.73_m2} (ref >59)

## 2022-03-13 ENCOUNTER — Telehealth: Payer: Self-pay

## 2022-03-13 NOTE — Telephone Encounter (Signed)
Patient notified of results via mychart

## 2022-03-13 NOTE — Telephone Encounter (Signed)
-----   Message from Park Liter, MD sent at 03/08/2022  4:57 PM EDT ----- Chem-7 looks good, continue present management

## 2022-04-19 ENCOUNTER — Ambulatory Visit: Payer: 59 | Admitting: Cardiology

## 2022-04-20 ENCOUNTER — Ambulatory Visit: Payer: 59 | Attending: Cardiology | Admitting: Cardiology

## 2022-04-20 ENCOUNTER — Encounter: Payer: Self-pay | Admitting: Cardiology

## 2022-04-20 VITALS — BP 132/82 | HR 65 | Ht 73.0 in | Wt 363.0 lb

## 2022-04-20 DIAGNOSIS — R0609 Other forms of dyspnea: Secondary | ICD-10-CM

## 2022-04-20 DIAGNOSIS — I2510 Atherosclerotic heart disease of native coronary artery without angina pectoris: Secondary | ICD-10-CM | POA: Diagnosis not present

## 2022-04-20 DIAGNOSIS — I10 Essential (primary) hypertension: Secondary | ICD-10-CM

## 2022-04-20 DIAGNOSIS — I5032 Chronic diastolic (congestive) heart failure: Secondary | ICD-10-CM

## 2022-04-20 NOTE — Patient Instructions (Signed)
Medication Instructions:  Your physician has recommended you make the following change in your medication:  Stop Coreg for 1 week and see if you feel better Given samples of Farxiga today/DO NOT TAKE UNTIL WE CALL YOU BACK WITH LAB RESULTS  *If you need a refill on your cardiac medications before your next appointment, please call your pharmacy*   Lab Work: Your physician recommends that you return for lab work in: Today for a BMP and ProBnp  If you have labs (blood work) drawn today and your tests are completely normal, you will receive your results only by: MyChart Message (if you have MyChart) OR A paper copy in the mail If you have any lab test that is abnormal or we need to change your treatment, we will call you to review the results.   Testing/Procedures: NONE   Follow-Up: At [facility], you and your health needs are our priority.  As part of our continuing mission to provide you with exceptional heart care, we have created designated Provider Care Teams.  These Care Teams include your primary Cardiologist (physician) and Advanced Practice Providers (APPs -  Physician Assistants and Nurse Practitioners) who all work together to provide you with the care you need, when you need it.  We recommend signing up for the patient portal called "MyChart".  Sign up information is provided on this After Visit Summary.  MyChart is used to connect with patients for Virtual Visits (Telemedicine).  Patients are able to view lab/test results, encounter notes, upcoming appointments, etc.  Non-urgent messages can be sent to your provider as well.   To learn more about what you can do with MyChart, go to [email or web address].    Your next appointment:   3 month(s)  The format for your next appointment:   In Person  Provider:   Gypsy Balsam, MD    Other Instructions   Important Information About Sugar

## 2022-04-20 NOTE — Progress Notes (Signed)
Cardiology Office Note:    Date:  04/20/2022   ID:  Jerome Phelps, DOB 10/21/1960, MRN [id number]  PCP:  Jerome Saupe, MD  Cardiologist:  Jerome Balsam, MD    Referring MD: Jerome Saupe, MD   Chief Complaint  Patient presents with   Shortness of Breath     Lasix not helping    History of Present Illness:    Jerome Phelps is a 61 y.o. male with past medical history significant for coronary artery disease, he did have cardiac catheterization done this year which showed 50% posterior lateral branch 70% obtuse marginal branch, at that time he was found to have significantly elevated LVEDP.  Additional problem include obstructive sleep apnea, morbid obesity, essential hypertension.  He was sent for to pulmonary to assess his pulmonary status and apparently there is no explanation for his shortness of breath.  Still complain of having shortness of breath while walking.  He parked his truck at the beginning of his drive.  That he walked to his home and he said now he have to stop once because of shortness of breath.  He has no chest pain tightness squeezing pressure burning chest.  He said he did not have to do it about a year ago.  Past Medical History:  Diagnosis Date   Baker's cyst    Carotid artery occlusion    Hyperlipidemia    Sleep apnea    Venous insufficiency     Past Surgical History:  Procedure Laterality Date   CORONARY ANGIOPLASTY     Right/Left   KNEE SURGERY Left    LEFT HEART CATH AND CORONARY ANGIOGRAPHY N/A 10/21/2021   Procedure: LEFT HEART CATH AND CORONARY ANGIOGRAPHY;  Surgeon: Corky Crafts, MD;  Location: [facility] INVASIVE CV LAB;  Service: Cardiovascular;  Laterality: N/A;    Current Medications: Current Meds  Medication Sig   aspirin EC 81 MG tablet Take 1 tablet (81 mg total) by mouth daily. Swallow whole.   carvedilol (COREG) 6.25 MG tablet Take 1 tablet (6.25 mg total) by mouth 2 (two) times daily with a meal.   clopidogrel (PLAVIX) 75  MG tablet Take 1 tablet (75 mg total) by mouth daily.   Fexofenadine-Pseudoephedrine (ALLEGRA-D 24 HOUR PO) Take 1 tablet by mouth daily as needed (for allergies).   furosemide (LASIX) 40 MG tablet Take 1.5 tablets (60 mg total) by mouth daily.   losartan (COZAAR) 25 MG tablet Take 1 tablet (25 mg total) by mouth daily.   meloxicam (MOBIC) 15 MG tablet Take 15 mg by mouth daily.   multivitamin (ONE-A-DAY MEN'S) TABS tablet Take 1 tablet by mouth daily with breakfast.   potassium chloride (KLOR-CON M) 10 MEQ tablet TAKE ONE TABLET BY MOUTH EVERY DAY   rosuvastatin (CRESTOR) 20 MG tablet Take 1 tablet (20 mg total) by mouth daily.     Allergies:   Atorvastatin   Social History   Socioeconomic History   Marital status: Married    Spouse name: Not on file   Number of children: 1   Years of education: Not on file   Highest education level: Not on file  Occupational History   Not on file  Tobacco Use   Smoking status: Never   Smokeless tobacco: Never  Vaping Use   Vaping Use: Never used  Substance and Sexual Activity   Alcohol use: No   Drug use: No   Sexual activity: Not on file  Other Topics Concern   Not on  file  Social History Narrative   Not on file   Social Determinants of Health   Financial Resource Strain: Not on file  Food Insecurity: Not on file  Transportation Needs: Not on file  Physical Activity: Not on file  Stress: Not on file  Social Connections: Not on file     Family History: The patient's family history includes Lung cancer in his father; Thyroid cancer in his mother. ROS:   Please see the history of present illness.    All 14 point review of systems negative except as described per history of present illness  EKGs/Labs/Other Studies Reviewed:      Recent Labs: 09/15/2021: TSH 1.86 10/23/2021: Hemoglobin 15.4; Platelets 179 10/28/2021: ALT 18 01/19/2022: NT-Pro BNP 86 03/03/2022: BUN 20; Creatinine, Ser 1.24; Potassium 4.5; Sodium 138  Recent  Lipid Panel    Component Value Date/Time   CHOL 120 10/28/2021 1010   TRIG 131 10/28/2021 1010   HDL 38 (L) 10/28/2021 1010   CHOLHDL 3.2 10/28/2021 1010   CHOLHDL 3.8 10/23/2021 0930   VLDL 31 10/23/2021 0930   LDLCALC 59 10/28/2021 1010    Physical Exam:    VS:  BP 132/82 (BP Location: Left Arm, Patient Position: Sitting)   Pulse 65   Ht 6\' 1"  (1.854 m)   Wt (!) 363 lb (164.7 kg)   SpO2 98%   BMI 47.89 kg/m     Wt Readings from Last 3 Encounters:  04/20/22 (!) 363 lb (164.7 kg)  02/23/22 (!) 360 lb 6.4 oz (163.5 kg)  02/16/22 (!) 360 lb (163.3 kg)     GEN:  Well nourished, well developed in no acute distress HEENT: Normal NECK: No JVD; No carotid bruits LYMPHATICS: No lymphadenopathy CARDIAC: RRR, no murmurs, no rubs, no gallops RESPIRATORY:  Clear to auscultation without rales, wheezing or rhonchi  ABDOMEN: Soft, non-tender, non-distended MUSCULOSKELETAL:  No edema; No deformity  SKIN: Warm and dry LOWER EXTREMITIES: no swelling NEUROLOGIC:  Alert and oriented x 3 PSYCHIATRIC:  Normal affect   ASSESSMENT:    1. Coronary artery disease involving native coronary artery of native heart without angina pectoris   2. Essential hypertension   3. Chronic diastolic congestive heart failure (HCC)   4. Morbid obesity (HCC)   5. Dyspnea on exertion    PLAN:    In order of problems listed above:  Dyspnea on exertion: Multifactorial.  His last proBNP was normal.  He said Lasix is not helping.  He thinks maybe some of the medication can give him a problem.  I will ask him to stop temporarily cardiac and see if he feels any better.  I will do proBNP Chem-7 and then will try to augment his diuresis.  In the meantime since he does have diastolic dysfunction will initiate SGLT2 blocker. Essential hypertension: Blood pressure seems well-controlled continue present management. Morbid obesity clearly a problem.  When he was doing quite well he lost significant amount of weight  that he did before his elective knee replacement surgery.  Slightly January 1 he tried to go back to the program and lose significant mount of weight that in my opinion can help tremendously. Obstructive sleep apnea he does have new equipment.  However he does not feel it helping him as much as it previously used equipment.   Medication Adjustments/Labs and Tests Ordered: Current medicines are reviewed at length with the patient today.  Concerns regarding medicines are outlined above.  Orders Placed This Encounter  Procedures   EKG  12-Lead   Medication changes: No orders of the defined types were placed in this encounter.   Signed, Georgeanna Lea, MD, [facility] 04/20/2022 8:29 AM    [facility]

## 2022-04-21 LAB — BASIC METABOLIC PANEL
BUN/Creatinine Ratio: 15 (ref 10–24)
BUN: 18 mg/dL (ref 8–27)
CO2: 24 mmol/L (ref 20–29)
Calcium: 8.8 mg/dL (ref 8.6–10.2)
Chloride: 104 mmol/L (ref 96–106)
Creatinine, Ser: 1.18 mg/dL (ref 0.76–1.27)
Glucose: 84 mg/dL (ref 70–99)
Potassium: 4.9 mmol/L (ref 3.5–5.2)
Sodium: 141 mmol/L (ref 134–144)
eGFR: 69 mL/min/{1.73_m2} (ref >59)

## 2022-04-21 LAB — PRO B NATRIURETIC PEPTIDE: NT-Pro BNP: 58 pg/mL (ref 0–210)

## 2022-04-25 ENCOUNTER — Telehealth: Payer: Self-pay

## 2022-04-25 MED ORDER — DAPAGLIFLOZIN PROPANEDIOL 10 MG PO TABS: 10.0000 mg | ORAL_TABLET | Freq: Every day | ORAL | 3 refills | Status: DC

## 2022-04-25 NOTE — Telephone Encounter (Signed)
Results reviewed with pt as per Dr. Krasowski's note.  Pt verbalized understanding and had no additional questions. Routed to PCP  

## 2022-05-08 DIAGNOSIS — N183 Chronic kidney disease, stage 3 unspecified: Secondary | ICD-10-CM | POA: Insufficient documentation

## 2022-05-08 HISTORY — DX: Chronic kidney disease, stage 3 unspecified: N18.30

## 2022-06-01 ENCOUNTER — Telehealth: Payer: Self-pay

## 2022-06-01 NOTE — Telephone Encounter (Signed)
Pt called stating that he was having increased bruising on arms, legs, abdomen. Some are small some are large. He is on Plavix and has not changed anything. Please Advise

## 2022-06-02 NOTE — Telephone Encounter (Signed)
Spoke with pt about bruising and Dr. Agustin Cree recommendations to stop Palvix and continue Aspirin. Pt agreed and will call if he has any problems.

## 2022-06-20 ENCOUNTER — Encounter: Payer: Self-pay | Admitting: Gastroenterology

## 2022-07-13 ENCOUNTER — Ambulatory Visit (AMBULATORY_SURGERY_CENTER): Payer: Managed Care, Other (non HMO)

## 2022-07-13 VITALS — Ht 73.0 in | Wt 357.0 lb

## 2022-07-13 DIAGNOSIS — Z1211 Encounter for screening for malignant neoplasm of colon: Secondary | ICD-10-CM

## 2022-07-13 MED ORDER — NA SULFATE-K SULFATE-MG SULF 17.5-3.13-1.6 GM/177ML PO SOLN: 1.0000 | Freq: Once | ORAL | 0 refills | Status: AC

## 2022-07-13 NOTE — Progress Notes (Signed)
No egg or soy allergy known to patient  No issues known to pt with past sedation with any surgeries or procedures Patient denies ever being told they had issues or difficulty with intubation  No FH of Malignant Hyperthermia Pt is not on diet pills Pt is not on  home 02  Pt is not on blood thinners  Pt denies issues with constipation . Pt states he does have issues on occation./ RN instructed pt to use Miralax  Pt is not on dialysis Pt denies any upcoming cardiac testing 07/24/22 Card MD visit. Instructed pt to call if put back on blood thinners. Pt states he will Denise chest pain or any issues with heart at this time.Only issues is gets SOB with activity Pt states weight is 357lb Pt encouraged to use to use Singlecare or Goodrx to reduce cost  Patient's chart reviewed by Osvaldo Angst CNRA prior to previsit and patient appropriate for the [place].  Previsit completed and red dot placed by patient's name on their procedure day (on provider's schedule).  . Visit by phone Instructions reviewed with pt and pt states understanding. Instructed to review again prior to procedure. Pt states they will. Instructions sent by mail with coupon if applicable and by my chart

## 2022-07-18 DIAGNOSIS — E785 Hyperlipidemia, unspecified: Secondary | ICD-10-CM | POA: Insufficient documentation

## 2022-07-18 DIAGNOSIS — K429 Umbilical hernia without obstruction or gangrene: Secondary | ICD-10-CM | POA: Insufficient documentation

## 2022-07-18 HISTORY — DX: Umbilical hernia without obstruction or gangrene: K42.9

## 2022-07-19 ENCOUNTER — Other Ambulatory Visit: Payer: Self-pay

## 2022-07-21 ENCOUNTER — Ambulatory Visit: Payer: Managed Care, Other (non HMO) | Attending: Cardiology | Admitting: Cardiology

## 2022-07-21 ENCOUNTER — Encounter: Payer: Self-pay | Admitting: Cardiology

## 2022-07-21 VITALS — BP 138/74 | HR 68 | Ht 73.0 in | Wt 360.4 lb

## 2022-07-21 DIAGNOSIS — I5032 Chronic diastolic (congestive) heart failure: Secondary | ICD-10-CM | POA: Diagnosis not present

## 2022-07-21 DIAGNOSIS — I2510 Atherosclerotic heart disease of native coronary artery without angina pectoris: Secondary | ICD-10-CM | POA: Diagnosis not present

## 2022-07-21 DIAGNOSIS — G4733 Obstructive sleep apnea (adult) (pediatric): Secondary | ICD-10-CM

## 2022-07-21 DIAGNOSIS — E785 Hyperlipidemia, unspecified: Secondary | ICD-10-CM | POA: Diagnosis not present

## 2022-07-21 MED ORDER — FUROSEMIDE 40 MG PO TABS: ORAL_TABLET | ORAL | 3 refills | Status: DC

## 2022-07-21 MED ORDER — POTASSIUM CHLORIDE CRYS ER 20 MEQ PO TBCR: 10.0000 meq | EXTENDED_RELEASE_TABLET | Freq: Every day | ORAL | 3 refills | Status: DC

## 2022-07-21 MED ORDER — POTASSIUM CHLORIDE CRYS ER 20 MEQ PO TBCR: 20.0000 meq | EXTENDED_RELEASE_TABLET | Freq: Every day | ORAL | 3 refills | Status: DC

## 2022-07-21 NOTE — Progress Notes (Signed)
Cardiology Office Note:    Date:  07/21/2022   ID:  Jerome Phelps, DOB 1960/06/02, MRN [id number]  PCP:  Doyle Askew, PA-C  Cardiologist:  Jenne Campus, MD    Referring MD: Angelina Sheriff, MD   Chief Complaint  Patient presents with   Shortness of Breath     CPAP not helping    Medication Management    Stop [place] due to upset stomach    History of Present Illness:    Jerome Phelps is a 62 y.o. male  with past medical history significant for coronary artery disease, he did have cardiac catheterization done this year which showed 50% posterior lateral branch 70% obtuse marginal branch, at that time he was found to have significantly elevated LVEDP. Additional problem include obstructive sleep apnea, morbid obesity, essential hypertension. He was sent for to pulmonary to assess his pulmonary status and apparently there is no explanation for his shortness of breath. Still complain of having shortness of breath while walking.  Comes today to months for follow-up this is a frustration situation still complain of having a lot of shortness of breath, no chest pain no tightness no squeezing or pressure no burning chest.  He was unable to tolerate [place].  Past Medical History:  Diagnosis Date   Baker's cyst    Baker's cyst 04/19/2021   Carotid artery occlusion    CHF (congestive heart failure) (HCC)    Chronic pain of both knees 05/29/2017   Dyslipidemia 11/10/2016   Dyspnea on exertion 03/25/2021   Hyperlipidemia    Low HDL (under 40) 03/24/2019   Morbid obesity ([place]) 12/11/2016   Myocardial infarction ([place])    10/21/22   Overweight 10/27/2021   Pure hypercholesterolemia    S/P TKR (total knee replacement) using cement, left 09/11/2019   Sleep apnea    Umbilical hernia without obstruction and without gangrene 07/18/2022   Venous insufficiency     Past Surgical History:  Procedure Laterality Date   CORONARY ANGIOPLASTY     Right/Left   KNEE SURGERY Left     LEFT HEART CATH AND CORONARY ANGIOGRAPHY N/A 10/21/2021   Procedure: LEFT HEART CATH AND CORONARY ANGIOGRAPHY;  Surgeon: Jettie Booze, MD;  Location: [facility] CV LAB;  Service: Cardiovascular;  Laterality: N/A;    Current Medications: Current Meds  Medication Sig   aspirin EC 81 MG tablet Take 1 tablet (81 mg total) by mouth daily. Swallow whole.   furosemide (LASIX) 40 MG tablet Take 1.5 tablets (60 mg total) by mouth daily.   meloxicam (MOBIC) 15 MG tablet Take 15 mg by mouth daily.   multivitamin (ONE-A-DAY MEN'S) TABS tablet Take 1 tablet by mouth daily with breakfast.   potassium chloride (KLOR-CON M) 10 MEQ tablet TAKE ONE TABLET BY MOUTH EVERY DAY   [DISCONTINUED] carvedilol (COREG) 6.25 MG tablet Take 1 tablet (6.25 mg total) by mouth 2 (two) times daily with a meal.   [DISCONTINUED] clopidogrel (PLAVIX) 75 MG tablet Take 1 tablet (75 mg total) by mouth daily.   [DISCONTINUED] dapagliflozin propanediol (FARXIGA) 10 MG TABS tablet Take 1 tablet (10 mg total) by mouth daily before breakfast.   [DISCONTINUED] Fexofenadine-Pseudoephedrine (ALLEGRA-D 24 HOUR PO) Take 1 tablet by mouth daily as needed (for allergies).   [DISCONTINUED] losartan (COZAAR) 25 MG tablet Take 1 tablet (25 mg total) by mouth daily.   [DISCONTINUED] potassium chloride (MICRO-K) 10 MEQ CR capsule Take 10 mEq by mouth daily.   [DISCONTINUED] rosuvastatin (CRESTOR) 20 MG tablet  Take 1 tablet (20 mg total) by mouth daily.     Allergies:   Atorvastatin   Social History   Socioeconomic History   Marital status: Married    Spouse name: Not on file   Number of children: 1   Years of education: Not on file   Highest education level: Not on file  Occupational History   Not on file  Tobacco Use   Smoking status: Never   Smokeless tobacco: Never  Vaping Use   Vaping Use: Never used  Substance and Sexual Activity   Alcohol use: No   Drug use: No   Sexual activity: Not on file  Other Topics Concern    Not on file  Social History Narrative   Not on file   Social Determinants of Health   Financial Resource Strain: Not on file  Food Insecurity: Not on file  Transportation Needs: Not on file  Physical Activity: Not on file  Stress: Not on file  Social Connections: Not on file     Family History: The patient's family history includes Lung cancer in his father; Thyroid cancer in his mother. There is no history of Colon cancer, Colon polyps, Esophageal cancer, Rectal cancer, or Stomach cancer. ROS:   Please see the history of present illness.    All 14 point review of systems negative except as described per history of present illness  EKGs/Labs/Other Studies Reviewed:      Recent Labs: 09/15/2021: TSH 1.86 10/23/2021: Hemoglobin 15.4; Platelets 179 10/28/2021: ALT 18 04/20/2022: BUN 18; Creatinine, Ser 1.18; NT-Pro BNP 58; Potassium 4.9; Sodium 141  Recent Lipid Panel    Component Value Date/Time   CHOL 120 10/28/2021 1010   TRIG 131 10/28/2021 1010   HDL 38 (L) 10/28/2021 1010   CHOLHDL 3.2 10/28/2021 1010   CHOLHDL 3.8 10/23/2021 0930   VLDL 31 10/23/2021 0930   LDLCALC 59 10/28/2021 1010    Physical Exam:    VS:  BP 138/74 (BP Location: Left Arm, Patient Position: Sitting)   Pulse 68   Ht 6\' 1"  (1.854 m)   Wt (!) 360 lb 6.4 oz (163.5 kg)   SpO2 95%   BMI 47.55 kg/m     Wt Readings from Last 3 Encounters:  07/21/22 (!) 360 lb 6.4 oz (163.5 kg)  07/13/22 (!) 357 lb (161.9 kg)  04/20/22 (!) 363 lb (164.7 kg)     GEN:  Well nourished, well developed in no acute distress HEENT: Normal NECK: No JVD; No carotid bruits LYMPHATICS: No lymphadenopathy CARDIAC: RRR, no murmurs, no rubs, no gallops RESPIRATORY:  Clear to auscultation without rales, wheezing or rhonchi  ABDOMEN: Soft, non-tender, non-distended MUSCULOSKELETAL:  No edema; No deformity  SKIN: Warm and dry LOWER EXTREMITIES: no swelling NEUROLOGIC:  Alert and oriented x 3 PSYCHIATRIC:  Normal affect    ASSESSMENT:    1. Chronic diastolic congestive heart failure ([place])   2. Coronary artery disease involving native coronary artery of native heart without angina pectoris   3. Obstructive sleep apnea syndrome   4. Dyslipidemia    PLAN:    In order of problems listed above:  Shortness of breath still present I still think is multifactorial obesity obviously play some role here P does have some lung condition he does have diastolic dysfunction, last time I checked his proBNP which was normal because of this frustration we will try to use a slightly higher dose of diuretic and see if that will make any difference.  He is  taking Lasix 60 mg daily I will increase dose to 80 in the morning and 20 in the afternoon.  I will also double the dose of his potassium will check his kidney function and potassium next week.  I also tried to give him Jardiance however I told him to try Lasix first and then Jardiance. Coronary disease stable and appropriate medication without any symptoms however shortness of breath could be anginal equivalent.  See him back in about a month to determine if we need to augment therapy for this problem. Obstructive sleep apnea, he does not think it is helping him. Dyslipidemia LDL 59 HDL 38 continue present management   Medication Adjustments/Labs and Tests Ordered: Current medicines are reviewed at length with the patient today.  Concerns regarding medicines are outlined above.  No orders of the defined types were placed in this encounter.  Medication changes: No orders of the defined types were placed in this encounter.   Signed, Park Liter, MD, [facility] 07/21/2022 8:26 AM    [place]

## 2022-07-21 NOTE — Addendum Note (Signed)
Addended by: Truddie Hidden on: 07/21/2022 08:44 AM   Modules accepted: Orders

## 2022-07-21 NOTE — Addendum Note (Signed)
Addended by: Truddie Hidden on: 07/21/2022 08:38 AM   Modules accepted: Orders

## 2022-07-21 NOTE — Patient Instructions (Signed)
Medication Instructions:  Your physician has recommended you make the following change in your medication:   Increase Lasix to 80 mg in am and 20 mg in pm  Increase potassium to 20 mEq daily  *If you need a refill on your cardiac medications before your next appointment, please call your pharmacy*   Lab Work: Your physician recommends that you return for lab work in: 1 week for a BMP. You do not need to fast or have an appointment. You can come Monday through Friday 8:30 am to 12:00 pm and 1:15 to 4:30.   If you have labs (blood work) drawn today and your tests are completely normal, you will receive your results only by: [place] (if you have MyChart) OR A paper copy in the mail If you have any lab test that is abnormal or we need to change your treatment, we will call you to review the results.   Testing/Procedures: None ordered   Follow-Up: At [facility], you and your health needs are our priority.  As part of our continuing mission to provide you with exceptional heart care, we have created designated Provider Care Teams.  These Care Teams include your primary Cardiologist (physician) and Advanced Practice Providers (APPs -  Physician Assistants and Nurse Practitioners) who all work together to provide you with the care you need, when you need it.  We recommend signing up for the patient portal called "MyChart".  Sign up information is provided on this After Visit Summary.  MyChart is used to connect with patients for Virtual Visits (Telemedicine).  Patients are able to view lab/test results, encounter notes, upcoming appointments, etc.  Non-urgent messages can be sent to your provider as well.   To learn more about what you can do with MyChart, go to [email or web address].    Your next appointment:   1 month(s)  The format for your next appointment:   In Person  Provider:   Jenne Campus, MD    Other Instructions none  Important Information About  [place]

## 2022-07-25 ENCOUNTER — Encounter: Payer: Self-pay | Admitting: Gastroenterology

## 2022-07-29 LAB — BASIC METABOLIC PANEL
BUN/Creatinine Ratio: 13 (ref 10–24)
BUN: 16 mg/dL (ref 8–27)
CO2: 25 mmol/L (ref 20–29)
Calcium: 8.7 mg/dL (ref 8.6–10.2)
Chloride: 104 mmol/L (ref 96–106)
Creatinine, Ser: 1.2 mg/dL (ref 0.76–1.27)
Glucose: 104 mg/dL — ABNORMAL HIGH (ref 70–99)
Potassium: 4.8 mmol/L (ref 3.5–5.2)
Sodium: 143 mmol/L (ref 134–144)
eGFR: 68 mL/min/{1.73_m2} (ref >59)

## 2022-08-02 ENCOUNTER — Telehealth: Payer: Self-pay

## 2022-08-02 DIAGNOSIS — M7989 Other specified soft tissue disorders: Secondary | ICD-10-CM

## 2022-08-02 DIAGNOSIS — R3989 Other symptoms and signs involving the genitourinary system: Secondary | ICD-10-CM

## 2022-08-02 DIAGNOSIS — M549 Dorsalgia, unspecified: Secondary | ICD-10-CM

## 2022-08-02 NOTE — Telephone Encounter (Signed)
Pt came in complaining of back pain and urine discoloration. Dr. Agustin Cree ordered CMP and UA.

## 2022-08-03 ENCOUNTER — Telehealth: Payer: Self-pay

## 2022-08-03 LAB — URINALYSIS
Bilirubin, UA: NEGATIVE
Glucose, UA: NEGATIVE
Leukocytes,UA: NEGATIVE
Nitrite, UA: NEGATIVE
Protein,UA: NEGATIVE
RBC, UA: NEGATIVE
Specific Gravity, UA: 1.024 (ref 1.005–1.030)
Urobilinogen, Ur: 1 mg/dL (ref 0.2–1.0)
pH, UA: 5.5 (ref 5.0–7.5)

## 2022-08-03 LAB — COMPREHENSIVE METABOLIC PANEL
ALT: 27 IU/L (ref 0–44)
AST: 24 IU/L (ref 0–40)
Albumin/Globulin Ratio: 2.1 (ref 1.2–2.2)
Albumin: 4.4 g/dL (ref 3.9–4.9)
Alkaline Phosphatase: 123 IU/L — ABNORMAL HIGH (ref 44–121)
BUN/Creatinine Ratio: 16 (ref 10–24)
BUN: 21 mg/dL (ref 8–27)
Bilirubin Total: 0.5 mg/dL (ref 0.0–1.2)
CO2: 25 mmol/L (ref 20–29)
Calcium: 9.3 mg/dL (ref 8.6–10.2)
Chloride: 102 mmol/L (ref 96–106)
Creatinine, Ser: 1.34 mg/dL — ABNORMAL HIGH (ref 0.76–1.27)
Globulin, Total: 2.1 g/dL (ref 1.5–4.5)
Glucose: 101 mg/dL — ABNORMAL HIGH (ref 70–99)
Potassium: 4.7 mmol/L (ref 3.5–5.2)
Sodium: 144 mmol/L (ref 134–144)
Total Protein: 6.5 g/dL (ref 6.0–8.5)
eGFR: 59 mL/min/{1.73_m2} — ABNORMAL LOW (ref >59)

## 2022-08-03 NOTE — Telephone Encounter (Signed)
Patient notified of Chem 7 results

## 2022-08-03 NOTE — Telephone Encounter (Signed)
Left My Chart message per Dr. Wendy Poet note. Routed to PCP.

## 2022-08-03 NOTE — Telephone Encounter (Signed)
Results reviewed with pt as per Dr. Krasowski's note.  Pt verbalized understanding and had no additional questions. Routed to PCP  

## 2022-08-04 ENCOUNTER — Telehealth: Payer: Self-pay

## 2022-08-04 NOTE — Telephone Encounter (Signed)
Pt viewed results on My Chart. Routed to PCP.  

## 2022-08-14 ENCOUNTER — Encounter: Payer: Self-pay | Admitting: Gastroenterology

## 2022-08-14 ENCOUNTER — Ambulatory Visit: Payer: Managed Care, Other (non HMO) | Admitting: Gastroenterology

## 2022-08-14 VITALS — BP 116/80 | HR 69 | Temp 98.9°F | Resp 12 | Ht 73.0 in | Wt 357.0 lb

## 2022-08-14 DIAGNOSIS — D122 Benign neoplasm of ascending colon: Secondary | ICD-10-CM

## 2022-08-14 DIAGNOSIS — Z1211 Encounter for screening for malignant neoplasm of colon: Secondary | ICD-10-CM

## 2022-08-14 MED ORDER — SODIUM CHLORIDE 0.9 % IV SOLN: 500.0000 mL | Freq: Once | INTRAVENOUS | Status: DC

## 2022-08-14 NOTE — Progress Notes (Signed)
Uneventful anesthetic. Report to pacu rn. Vss. Care resumed by rn. 

## 2022-08-14 NOTE — Progress Notes (Signed)
[facility] History and Physical   Primary Care Physician:  Exie Parody   Reason for Procedure:   CRC screening  Plan:    Colonoscopy     HPI: Jerome Encalade is a 62 y.o. male    Past Medical History:  Diagnosis Date   Baker's cyst    Baker's cyst 04/19/2021   Carotid artery occlusion    CHF (congestive heart failure)    Chronic pain of both knees 05/29/2017   Dyslipidemia 11/10/2016   Dyspnea on exertion 03/25/2021   Hyperlipidemia    Low HDL (under 40) 03/24/2019   Morbid obesity 12/11/2016   Myocardial infarction    10/21/22   Overweight 10/27/2021   Pure hypercholesterolemia    S/P TKR (total knee replacement) using cement, left 09/11/2019   Sleep apnea    Umbilical hernia without obstruction and without gangrene 07/18/2022   Venous insufficiency     Past Surgical History:  Procedure Laterality Date   CORONARY ANGIOPLASTY     Right/Left   KNEE SURGERY Left    LEFT HEART CATH AND CORONARY ANGIOGRAPHY N/A 10/21/2021   Procedure: LEFT HEART CATH AND CORONARY ANGIOGRAPHY;  Surgeon: Corky Crafts, MD;  Location: [facility] INVASIVE CV LAB;  Service: Cardiovascular;  Laterality: N/A;    Prior to Admission medications   Medication Sig Start Date End Date Taking? Authorizing Provider  aspirin EC 81 MG tablet Take 1 tablet (81 mg total) by mouth daily. Swallow whole. 10/24/21   Azalee Course, PA  furosemide (LASIX) 40 MG tablet Take 80 mg (2 tablets) in the mroning and 20 mg (0.5 tablet) in the evening. 07/21/22   Georgeanna Lea, MD  meloxicam (MOBIC) 15 MG tablet Take 15 mg by mouth daily.    [provider]  multivitamin (ONE-A-DAY MEN'S) TABS tablet Take 1 tablet by mouth daily with breakfast.    [provider]  potassium chloride SA (KLOR-CON M) 20 MEQ tablet Take 1 tablet (20 mEq total) by mouth daily. 07/21/22   Georgeanna Lea, MD    Current Outpatient Medications  Medication Sig Dispense Refill   aspirin EC 81 MG  tablet Take 1 tablet (81 mg total) by mouth daily. Swallow whole.     furosemide (LASIX) 40 MG tablet Take 80 mg (2 tablets) in the mroning and 20 mg (0.5 tablet) in the evening. 225 tablet 3   meloxicam (MOBIC) 15 MG tablet Take 15 mg by mouth daily.     multivitamin (ONE-A-DAY MEN'S) TABS tablet Take 1 tablet by mouth daily with breakfast.     potassium chloride SA (KLOR-CON M) 20 MEQ tablet Take 1 tablet (20 mEq total) by mouth daily. 90 tablet 3   Current Facility-Administered Medications  Medication Dose Route Frequency Provider Last Rate Last Admin   0.9 %  sodium chloride infusion  500 mL Intravenous Once Lynann Bologna, MD        Allergies as of 08/14/2022 - Review Complete 08/14/2022  Allergen Reaction Noted   Atorvastatin Other (See Comments) 08/22/2021    Family History  Problem Relation Age of Onset   Thyroid cancer Mother    Lung cancer Father    Colon cancer Neg Hx    Colon polyps Neg Hx    Esophageal cancer Neg Hx    Rectal cancer Neg Hx    Stomach cancer Neg Hx     Social History   Socioeconomic History   Marital status: Married    Spouse name: Not on file  Number of children: 1   Years of education: Not on file   Highest education level: Not on file  Occupational History   Not on file  Tobacco Use   Smoking status: Never   Smokeless tobacco: Never  Vaping Use   Vaping Use: Never used  Substance and Sexual Activity   Alcohol use: No   Drug use: No   Sexual activity: Not on file  Other Topics Concern   Not on file  Social History Narrative   Not on file   Social Determinants of Health   Financial Resource Strain: Not on file  Food Insecurity: Not on file  Transportation Needs: Not on file  Physical Activity: Not on file  Stress: Not on file  Social Connections: Not on file  Intimate Partner Violence: Not on file    Review of Systems: Positive for none All other review of systems negative except as mentioned in the HPI.  Physical  Exam: Vital signs in last 24 hours: @VSRANGES @   General:   Alert,  Well-developed, well-nourished, pleasant and cooperative in NAD Lungs:  Clear throughout to auscultation.   Heart:  Regular rate and rhythm; no murmurs, clicks, rubs,  or gallops. Abdomen:  Soft, nontender and nondistended. Normal bowel sounds.   Neuro/Psych:  Alert and cooperative. Normal mood and affect. A and O x 3    No significant changes were identified.  The patient continues to be an appropriate candidate for the planned procedure and anesthesia.   Edman Circle, MD. [facility] 08/14/2022 8:13 AM@

## 2022-08-14 NOTE — Op Note (Signed)
[facility] Patient Name: Jerome Phelps Procedure Date: 08/14/2022 7:30 AM MRN: [id number] Endoscopist: Lynann Bologna , MD, [id number] Age: 62 Referring MD:  Date of Birth: 1960/12/13 Gender: Male Account #: [id number] Procedure:                Colonoscopy Indications:              Screening for colorectal malignant neoplasm Medicines:                Monitored Anesthesia Care Procedure:                Pre-Anesthesia Assessment:                           - Prior to the procedure, a History and Physical                            was performed, and patient medications and                            allergies were reviewed. The patient's tolerance of                            previous anesthesia was also reviewed. The risks                            and benefits of the procedure and the sedation                            options and risks were discussed with the patient.                            All questions were answered, and informed consent                            was obtained. Prior Anticoagulants: The patient has                            taken no anticoagulant or antiplatelet agents. ASA                            Grade Assessment: II - A patient with mild systemic                            disease. After reviewing the risks and benefits,                            the patient was deemed in satisfactory condition to                            undergo the procedure.                           After obtaining informed consent, the colonoscope  was passed under direct vision. Throughout the                            procedure, the patient's blood pressure, pulse, and                            oxygen saturations were monitored continuously. The                            Olympus CF-HQ190L [id number]) Colonoscope was                            introduced through the anus and advanced to the the                            cecum, identified by  appendiceal orifice and                            ileocecal valve. The colonoscopy was performed                            without difficulty. The patient tolerated the                            procedure well. The quality of the bowel                            preparation was adequate to identify polyps. The                            ileocecal valve, appendiceal orifice, and rectum                            were photographed. Scope In: 8:16:01 AM Scope Out: 8:30:03 AM Scope Withdrawal Time: 0 hours 11 minutes 8 seconds  Total Procedure Duration: 0 hours 14 minutes 2 seconds  Findings:                 Two sessile polyps were found in the proximal                            ascending colon and mid ascending colon. The polyps                            were 4 to 6 mm in size. These polyps were removed                            with a cold snare. Resection and retrieval were                            complete.                           A few small-mouthed diverticula were found in the  sigmoid colon.                           Non-bleeding internal hemorrhoids were found during                            retroflexion. The hemorrhoids were small and Grade                            I (internal hemorrhoids that do not prolapse).                           The exam was otherwise without abnormality on                            direct and retroflexion views. Complications:            No immediate complications. Estimated Blood Loss:     Estimated blood loss: none. Impression:               - Two 4 to 6 mm polyps in the proximal ascending                            colon and in the mid ascending colon, removed with                            a cold snare. Resected and retrieved.                           - Mild sigmoid diverticulosis                           - Non-bleeding internal hemorrhoids.                           - The examination was otherwise normal on  direct                            and retroflexion views. Recommendation:           - Patient has a contact number available for                            emergencies. The signs and symptoms of potential                            delayed complications were discussed with the                            patient. Return to normal activities tomorrow.                            Written discharge instructions were provided to the                            patient.                           -  Resume previous diet.                           - Continue present medications.                           - Await pathology results.                           - Repeat colonoscopy for surveillance based on                            pathology results (with 2 day prep).                           - The findings and recommendations were discussed                            with the patient's family. Lynann Bologna, MD 08/14/2022 8:36:39 AM This report has been signed electronically.

## 2022-08-14 NOTE — Patient Instructions (Signed)
    Handout on polyps & hemorrhoids  given to you today  Await pathology results on polyps removed    YOU HAD AN ENDOSCOPIC PROCEDURE TODAY AT THE [facility]:   Refer to the procedure report that was given to you for any specific questions about what was found during the examination.  If the procedure report does not answer your questions, please call your gastroenterologist to clarify.  If you requested that your care partner not be given the details of your procedure findings, then the procedure report has been included in a sealed envelope for you to review at your convenience later.  YOU SHOULD EXPECT: Some feelings of bloating in the abdomen. Passage of more gas than usual.  Walking can help get rid of the air that was put into your GI tract during the procedure and reduce the bloating. If you had a lower endoscopy (such as a colonoscopy or flexible sigmoidoscopy) you may notice spotting of blood in your stool or on the toilet paper. If you underwent a bowel prep for your procedure, you may not have a normal bowel movement for a few days.  Please Note:  You might notice some irritation and congestion in your nose or some drainage.  This is from the oxygen used during your procedure.  There is no need for concern and it should clear up in a day or so.  SYMPTOMS TO REPORT IMMEDIATELY:  Following lower endoscopy (colonoscopy or flexible sigmoidoscopy):  Excessive amounts of blood in the stool  Significant tenderness or worsening of abdominal pains  Swelling of the abdomen that is new, acute  Fever of 100F or higher   For urgent or emergent issues, a gastroenterologist can be reached at any hour by calling [phone]. Do not use MyChart messaging for urgent concerns.    DIET:  We do recommend a small meal at first, but then you may proceed to your regular diet.  Drink plenty of fluids but you should avoid alcoholic beverages for 24 hours.  ACTIVITY:  You should plan to  take it easy for the rest of today and you should NOT DRIVE or use heavy machinery until tomorrow (because of the sedation medicines used during the test).    FOLLOW UP: Our staff will call the number listed on your records the next business day following your procedure.  We will call around 7:15- 8:00 am to check on you and address any questions or concerns that you may have regarding the information given to you following your procedure. If we do not reach you, we will leave a message.     If any biopsies were taken you will be contacted by phone or by letter within the next 1-3 weeks.  Please call us at [phone] if you have not heard about the biopsies in 3 weeks.    SIGNATURES/CONFIDENTIALITY: You and/or your care partner have signed paperwork which will be entered into your electronic medical record.  These signatures attest to the fact that that the information above on your After Visit Summary has been reviewed and is understood.  Full responsibility of the confidentiality of this discharge information lies with you and/or your care-partner.

## 2022-08-14 NOTE — Progress Notes (Signed)
Pt's states no medical or surgical changes since previsit or office visit. 

## 2022-08-14 NOTE — Progress Notes (Signed)
Called to room to assist during endoscopic procedure.  Patient ID and intended procedure confirmed with present staff. Received instructions for my participation in the procedure from the performing physician.  

## 2022-08-15 ENCOUNTER — Telehealth: Payer: Self-pay | Admitting: *Deleted

## 2022-08-15 NOTE — Telephone Encounter (Signed)
Left message on f/u call 

## 2022-08-24 ENCOUNTER — Encounter: Payer: Self-pay | Admitting: Gastroenterology

## 2022-08-24 DIAGNOSIS — I219 Acute myocardial infarction, unspecified: Secondary | ICD-10-CM | POA: Insufficient documentation

## 2022-08-24 DIAGNOSIS — I509 Heart failure, unspecified: Secondary | ICD-10-CM | POA: Insufficient documentation

## 2022-08-30 ENCOUNTER — Encounter: Payer: Self-pay | Admitting: Cardiology

## 2022-08-30 NOTE — Progress Notes (Unsigned)
Cardiology Office Note:    Date:  08/31/2022   ID:  Jerome Phelps, DOB June 04, 1960, MRN [id number]  PCP:  Exie Parody   [facility] Providers Cardiologist:  Gypsy Balsam, MD     Referring MD: Rick Duff, PA-C   CC: follow up SOB  History of Present Illness:    Jerome Phelps is a 62 y.o. 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  Carotid ultrasound on 09/02/2021 revealed no stenosis bilaterally.  He presented to [facility] with exertional chest pain and shortness of breath on 10/20/2021.  Echo at this time revealed an EF greater than 55%, mild concentric LVH, unable to exclude subtle RWMA, he had elevated troponin 0.04  > 1.3 > 5.99.  He was transferred to [facility].  LHC on 10/26/2021 LPA V lesion 50% stenosed, first marginal lesion 70% stenosed, moderate diffuse CAD.  Aggressive secondary prevention was recommended including statin therapy, and Plavix therapy x 1 year.   Most recently evaluated by Dr. Bing Matter on 07/21/2022 after recently following up with pulmonology for shortness of breath.  From their perspective, there was no explanation for her shortness of breath.  He was still complaining of SOB while walking.  His complaints were felt to be multifactorial related to obesity, diastolic dysfunction.  His Lasix was increased from 60 mg daily to 80 mg in the morning and 20 mg in the afternoon along with increasing his potassium supplementation.  He presents today for follow-up of his shortness of breath.  He states since his diuretic was increased he feels slightly better, but not significantly different.  He stated when the diuretic was initially increased he had good response with diuresing x 2  weeks and then noticed it really was not making much of a difference.  He recently met with his PCP to discuss weight loss medications however his insurance plan does not cover any medications for weight loss.  He does feel like his weight is contributing to his shortness of breath.  Today he weighs 360 in the office, when he had his knee replaced a few years ago he weighed 298 and felt much better at that time.  There are plans to replace his other knee in the near future and he is eager to lose weight again.  He denies chest pain, palpitations, dyspnea, pnd, orthopnea, n, v, dizziness, syncope, edema, weight gain, or early satiety.   Past Medical History:  Diagnosis Date   Ascending aorta dilatation 10/2021   4.3 cm   Baker's cyst 04/19/2021   Carotid artery occlusion    CHF (congestive heart failure)    Chronic pain of both knees 05/29/2017   CKD (chronic kidney disease) stage 3, GFR 30-59 ml/min 2024   Coronary artery disease 11/10/2016   Cardiac catheterization done in 2014 showing 20% LAD   Diastolic congestive heart failure    Dyslipidemia 11/10/2016   Dyspnea on exertion 03/25/2021   Essential hypertension 08/22/2021   Hyperlipidemia    Low HDL (under 40) 03/24/2019   Lumbar radiculopathy 11/04/2013   Morbid obesity 12/11/2016   Myocardial infarction    10/21/22   NSTEMI (non-ST elevated myocardial infarction) 10/21/2021   Overweight 10/27/2021   Peripheral neuropathy, idiopathic 11/04/2013   Pure hypercholesterolemia    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 Swallow whole.   furosemide (LASIX) 40 MG tablet Take 80 mg (2 tablets) in the mroning and 20 mg (0.5 tablet) in the evening.   meloxicam (MOBIC) 15 MG tablet Take 15 mg by mouth daily.   multivitamin (ONE-A-DAY MEN'S) TABS tablet Take 1 tablet by mouth daily with breakfast.   potassium chloride SA (KLOR-CON M) 20 MEQ tablet Take 1 tablet (20 mEq total) by mouth daily.     Allergies:   Atorvastatin   Social History   Socioeconomic History   Marital status: Married    Spouse name: Not on file   Number of children: 1   Years of education: Not on file   Highest education level: Not on file  Occupational History   Not on file  Tobacco Use   Smoking status: Never   Smokeless tobacco: Never  Vaping Use   Vaping Use: Never used  Substance and Sexual Activity   Alcohol use: No   Drug use: No   Sexual activity: Not on file  Other Topics Concern   Not on file  Social History Narrative   Not on file   Social Determinants of Health   Financial Resource Strain: Not on file  Food Insecurity: Not on file  Transportation Needs: Not on file  Physical Activity: Not on file  Stress: Not on file  Social Connections: Not on file     Family History: The patient's family history includes Lung cancer in his father; Thyroid cancer in his mother. There is no history of Colon cancer, Colon polyps, Esophageal cancer, Rectal cancer, or Stomach cancer.  ROS:   Please see the history of present illness.     All other systems reviewed and are negative.  EKGs/Labs/Other Studies Reviewed:    The following studies were reviewed today: Cardiac Studies & Procedures   CARDIAC CATHETERIZATION  CARDIAC CATHETERIZATION 10/26/2021  Narrative   LPAV lesion is 50% stenosed.   1st Mrg lesion is 70% stenosed.   LV end diastolic  pressure is severely elevated.  LVEDP 30 mmHg.   The left ventricular ejection fraction is 55-65% by visual estimate.   There is no aortic valve stenosis.   Moderate, diffuse coronary disease noted throughout the left system, most prominently in the LAD.  Mildly elevated troponin done at [facility].  No obvious culprit lesion.  His LVEDP is very high.  He has some diffuse, branch vessel lesions.  This may have been demand ischemia, which is consistent with his normal EF noted by echo and by cath.  Diurese aggressively and try to optimize medications.  He will need aggressive secondary prevention including statin therapy.  Clopidogrel also started in the Cath Lab due to elevated troponin.  Findings Coronary Findings Diagnostic  Dominance: Left  Left Anterior Descending There is moderate diffuse disease throughout the vessel.  Left Circumflex There is mild diffuse disease throughout the vessel.  First Obtuse Marginal Branch 1st Mrg lesion is 70% stenosed. The lesion is irregular.  Left Posterior Atrioventricular Artery LPAV lesion is 50% stenosed.  Intervention  No interventions have been documented.   STRESS TESTS  MYOCARDIAL PERFUSION IMAGING 06/25/2019  Narrative  Nuclear stress EF: 63%.  The left ventricular ejection fraction is normal (55-65%).  There was no ST segment deviation noted during stress.  No T wave inversion was noted during stress.  The study is normal.  This is a low risk study.  Low risk stress nuclear study with normal perfusion and normal left ventricular regional and global systolic function.   ECHOCARDIOGRAM  ECHOCARDIOGRAM COMPLETE 04/17/2021  Narrative ECHOCARDIOGRAM REPORT    Patient Name:   Jerome Phelps Date of Exam: 04/15/2021 Medical Rec #:  [id number]    Height:       73.0 in Accession #:    [id number]   Weight:       369.8 lb Date of Birth:  08-16-1960     BSA:          2.794 m Patient Age:    60 years     BP:            136/84 mmHg Patient Gender: M            HR:           81 bpm. Exam Location:  [place]  Procedure: 2D Echo, Cardiac Doppler, Color Doppler and Strain Analysis  Indications:    Dyspnea R06.00  History:        Patient has no prior history of Echocardiogram examinations. CAD; Signs/Symptoms:Swelling of both lower extremities.  Sonographer:    Louie Boston RDCS Referring Phys: [id number] Georgeanna Lea   Sonographer Comments: Technically challenging study due to limited acoustic windows. IMPRESSIONS   1. Left ventricular ejection fraction, by estimation, is 50 to 55%. The left ventricle has low normal function. Left ventricular endocardial border not optimally defined to evaluate regional wall motion. There is moderate concentric left ventricular hypertrophy. Left ventricular diastolic parameters are consistent with Grade I diastolic dysfunction (impaired relaxation). 2. Right ventricular systolic function is normal. The right ventricular size is normal. Tricuspid regurgitation signal is inadequate for assessing PA pressure. 3. The mitral valve is normal in structure. No evidence of mitral valve regurgitation. No evidence of mitral stenosis. 4. The aortic valve is tricuspid. Aortic valve regurgitation is not visualized. No aortic stenosis is present. 5. There is moderate dilatation of the ascending aorta, measuring 40 mm.  FINDINGS Left Ventricle: Left ventricular ejection fraction, by estimation, is 50 to 55%. The left ventricle has low normal function. Left ventricular endocardial border not optimally defined to evaluate regional wall motion. Global longitudinal strain performed but not reported based on interpreter judgement due to suboptimal tracking. The left ventricular internal cavity size was normal in size. There is moderate concentric left ventricular hypertrophy. Left ventricular diastolic parameters are consistent with Grade I diastolic dysfunction (impaired relaxation).  Indeterminate filling pressures.  Right Ventricle: The right ventricular size is normal. No increase in right ventricular wall thickness. Right ventricular systolic function is normal. Tricuspid regurgitation signal is inadequate for assessing PA pressure. The tricuspid regurgitant velocity is 1.66 m/s, and with an assumed right atrial pressure of 3 mmHg, the estimated right ventricular systolic pressure is 14.0 mmHg.  Left Atrium: Left atrial size was normal in size.  Right Atrium: Right atrial size was normal in  size.  Pericardium: There is no evidence of pericardial effusion.  Mitral Valve: The mitral valve is normal in structure. No evidence of mitral valve regurgitation. No evidence of mitral valve stenosis.  Tricuspid Valve: The tricuspid valve is normal in structure. Tricuspid valve regurgitation is trivial. No evidence of tricuspid stenosis.  Aortic Valve: The aortic valve is tricuspid. Aortic valve regurgitation is not visualized. No aortic stenosis is present.  Pulmonic Valve: The pulmonic valve was normal in structure. Pulmonic valve regurgitation is trivial. No evidence of pulmonic stenosis.  Aorta: The aortic root is normal in size and structure and the aortic arch was not well visualized. There is moderate dilatation of the ascending aorta, measuring 40 mm.  Venous: A normal flow pattern is recorded from the right upper pulmonary vein. The inferior vena cava was not well visualized.  IAS/Shunts: No atrial level shunt detected by color flow Doppler.   LEFT VENTRICLE PLAX 2D LVIDd:         4.00 cm   Diastology LVIDs:         3.10 cm   LV e' medial:    5.87 cm/s LV PW:         1.40 cm   LV E/e' medial:  16.1 LV IVS:        1.40 cm   LV e' lateral:   7.51 cm/s LVOT diam:     2.50 cm   LV E/e' lateral: 12.6 LV SV:         121 LV SV Index:   43 LVOT Area:     4.91 cm   RIGHT VENTRICLE RV S prime:     15.20 cm/s TAPSE (M-mode): 4.0 cm  LEFT ATRIUM             Index         RIGHT ATRIUM           Index LA diam:        3.90 cm 1.40 cm/m   RA Area:     18.40 cm LA Vol (A2C):   78.1 ml 27.95 ml/m  RA Volume:   52.90 ml  18.93 ml/m LA Vol (A4C):   82.8 ml 29.64 ml/m LA Biplane Vol: 81.6 ml 29.21 ml/m AORTIC VALVE LVOT Vmax:   105.00 cm/s LVOT Vmean:  79.000 cm/s LVOT VTI:    0.246 m  AORTA Ao Root diam: 3.60 cm Ao Asc diam:  4.05 cm  MITRAL VALVE               TRICUSPID VALVE MV Area (PHT): 3.48 cm    TR Peak grad:   11.0 mmHg MV Decel Time: 218 msec    TR Vmax:        166.00 cm/s MV E velocity: 94.70 cm/s MV A velocity: 78.00 cm/s  SHUNTS MV E/A ratio:  1.21        Systemic VTI:  0.25 m Systemic Diam: 2.50 cm  Norman Herrlich MD Electronically signed by Norman Herrlich MD Signature Date/Time: 04/17/2021/9:50:49 AM    Final              EKG:  EKG is not ordered today.    Recent Labs: 09/15/2021: TSH 1.86 10/23/2021: Hemoglobin 15.4; Platelets 179 04/20/2022: NT-Pro BNP 58 08/02/2022: ALT 27; BUN 21; Creatinine, Ser 1.34; Potassium 4.7; Sodium 144  Recent Lipid Panel    Component Value Date/Time   CHOL 120 10/28/2021 1010   TRIG 131 10/28/2021 1010   HDL 38 (L) 10/28/2021 1010  CHOLHDL 3.2 10/28/2021 1010   CHOLHDL 3.8 10/23/2021 0930   VLDL 31 10/23/2021 0930   LDLCALC 59 10/28/2021 1010     Risk Assessment/Calculations:             Physical Exam:    VS:  BP (!) 130/96 (BP Location: Right Arm, Patient Position: Sitting, Cuff Size: Large)   Pulse 82   Ht 6\' 1"  (1.854 m)   Wt (!) 360 lb (163.3 kg)   SpO2 94%   BMI 47.50 kg/m     Wt Readings from Last 3 Encounters:  08/31/22 (!) 360 lb (163.3 kg)  08/14/22 (!) 357 lb (161.9 kg)  07/21/22 (!) 360 lb 6.4 oz (163.5 kg)     GEN:  Well nourished, well developed in no acute distress HEENT: Normal NECK: No JVD; No carotid bruits LYMPHATICS: No lymphadenopathy CARDIAC: RRR, no murmurs, rubs, gallops RESPIRATORY:  Clear to auscultation without rales, wheezing or  rhonchi  ABDOMEN: Soft, non-tender, non-distended MUSCULOSKELETAL:  No edema; No deformity  SKIN: Warm and dry NEUROLOGIC:  Alert and oriented x 3 PSYCHIATRIC:  Normal affect   ASSESSMENT:    1. Coronary artery disease involving native coronary artery of native heart without angina pectoris   2. Chronic diastolic congestive heart failure   3. Dyslipidemia   4. Essential hypertension   5. Obstructive sleep apnea syndrome   6. Dyspnea on exertion    PLAN:    In order of problems listed above:  CAD -most recent LHC in 2023 showed moderate diffuse CAD, continue aspirin 81 mg daily.  He was previously on Plavix and a statin however these were discontinued to see if they were contributing to his shortness of breath.  He does feel slightly better since stopping them. Stable with no anginal symptoms. No indication for ischemic evaluation.    DOE -his shortness of breath is slightly better, he did not feel he had good response with additional Lasix, hard to determine if his breathing is better due to stopping his Plavix and statin, or increased with Lasix.  Will stop his p.m. Lasix dose as he does not notice any diuresing with this.  Will start Jardiance 10 mg daily, samples were previously provided, to see if this improves his DOE any. Previous workup with pulmonology was unrevealing.   Diastolic HF - NYHA class II--although not clear if this is related to his weight, deconditioning. Continue lasix 80mg  daily, start Jardiance 10 mg daily. Repeat CMET today, BMET in two weeks.   HLD -lipoprotein a 34.7, most recent LDL on 10/28/2021 was controlled at 59. Statin was d/c'd previously to see if this was contributing to his DOE. Will recheck CMET today and FLP.   HTN - BP 130/96, however he has not taken his am dose of lasix yet and had a diet mountain dew on the way in. If remains elevated at other OV, will plan to start amlodipine.   OSA - compliant with CPAP.      Disposition - repeat CMET, FLP  today. Stop second dose of lasix. Start Jardiance 10 mg daily. Repeat BMET in two weeks. Return in 3 months.       Medication Adjustments/Labs and Tests Ordered: Current medicines are reviewed at length with the patient today.  Concerns regarding medicines are outlined above.  No orders of the defined types were placed in this encounter.  No orders of the defined types were placed in this encounter.   There are no Patient Instructions on file for  this visit.   Signed, Flossie Dibble, NP  08/31/2022 8:32 AM    [facility]

## 2022-08-31 ENCOUNTER — Encounter: Payer: Self-pay | Admitting: Cardiology

## 2022-08-31 ENCOUNTER — Ambulatory Visit: Payer: Managed Care, Other (non HMO) | Attending: Cardiology | Admitting: Cardiology

## 2022-08-31 VITALS — BP 130/96 | HR 82 | Ht 73.0 in | Wt 360.0 lb

## 2022-08-31 DIAGNOSIS — E785 Hyperlipidemia, unspecified: Secondary | ICD-10-CM | POA: Diagnosis not present

## 2022-08-31 DIAGNOSIS — I5032 Chronic diastolic (congestive) heart failure: Secondary | ICD-10-CM | POA: Diagnosis not present

## 2022-08-31 DIAGNOSIS — I2510 Atherosclerotic heart disease of native coronary artery without angina pectoris: Secondary | ICD-10-CM | POA: Diagnosis not present

## 2022-08-31 DIAGNOSIS — I10 Essential (primary) hypertension: Secondary | ICD-10-CM

## 2022-08-31 DIAGNOSIS — Z6841 Body Mass Index (BMI) 40.0 and over, adult: Secondary | ICD-10-CM

## 2022-08-31 DIAGNOSIS — G4733 Obstructive sleep apnea (adult) (pediatric): Secondary | ICD-10-CM

## 2022-08-31 DIAGNOSIS — R0609 Other forms of dyspnea: Secondary | ICD-10-CM

## 2022-08-31 MED ORDER — EMPAGLIFLOZIN 10 MG PO TABS: 10.0000 mg | ORAL_TABLET | Freq: Every day | ORAL | 3 refills | Status: DC

## 2022-08-31 MED ORDER — FUROSEMIDE 40 MG PO TABS: 80.0000 mg | ORAL_TABLET | Freq: Every day | ORAL | 3 refills | Status: DC

## 2022-08-31 NOTE — Addendum Note (Signed)
Addended by: Eleonore Chiquito on: 08/31/2022 04:11 PM   Modules accepted: Orders

## 2022-08-31 NOTE — Patient Instructions (Signed)
Medication Instructions:  Your physician has recommended you make the following change in your medication:  Stop the 2nd dose of Furosemide in the afternoon Start Jardiance 10 mg once daily  *If you need a refill on your cardiac medications before your next appointment, please call your pharmacy*   Lab Work: Your physician recommends that you return for lab work in: Today for a CMP and Lipid Then in 2 weeks for a BMP Lab opens at 8am. You DO NOT NEED an appointment. Best time to come is between 8am and 12noon and between 1:30 and 4:30. If you have been asked to fast for your blood work please have nothing to eat or drink after midnight. You may have water.   If you have labs (blood work) drawn today and your tests are completely normal, you will receive your results only by: MyChart Message (if you have MyChart) OR A paper copy in the mail If you have any lab test that is abnormal or we need to change your treatment, we will call you to review the results.   Testing/Procedures: NONE   Follow-Up: At [facility], you and your health needs are our priority.  As part of our continuing mission to provide you with exceptional heart care, we have created designated Provider Care Teams.  These Care Teams include your primary Cardiologist (physician) and Advanced Practice Providers (APPs -  Physician Assistants and Nurse Practitioners) who all work together to provide you with the care you need, when you need it.  We recommend signing up for the patient portal called "MyChart".  Sign up information is provided on this After Visit Summary.  MyChart is used to connect with patients for Virtual Visits (Telemedicine).  Patients are able to view lab/test results, encounter notes, upcoming appointments, etc.  Non-urgent messages can be sent to your provider as well.   To learn more about what you can do with MyChart, go to [email or web address].    Your next appointment:   3  month(s)  Provider:   Gypsy Balsam, MD    Other Instructions

## 2022-09-01 LAB — COMPREHENSIVE METABOLIC PANEL WITH GFR
ALT: 16 IU/L (ref 0–44)
AST: 17 IU/L (ref 0–40)
Albumin/Globulin Ratio: 1.8 (ref 1.2–2.2)
Albumin: 3.8 g/dL — ABNORMAL LOW (ref 3.9–4.9)
Alkaline Phosphatase: 111 IU/L (ref 44–121)
BUN/Creatinine Ratio: 13 (ref 10–24)
BUN: 16 mg/dL (ref 8–27)
Bilirubin Total: 0.4 mg/dL (ref 0.0–1.2)
CO2: 23 mmol/L (ref 20–29)
Calcium: 8.7 mg/dL (ref 8.6–10.2)
Chloride: 106 mmol/L (ref 96–106)
Creatinine, Ser: 1.25 mg/dL (ref 0.76–1.27)
Globulin, Total: 2.1 g/dL (ref 1.5–4.5)
Glucose: 101 mg/dL — ABNORMAL HIGH (ref 70–99)
Potassium: 4.6 mmol/L (ref 3.5–5.2)
Sodium: 145 mmol/L — ABNORMAL HIGH (ref 134–144)
Total Protein: 5.9 g/dL — ABNORMAL LOW (ref 6.0–8.5)
eGFR: 64 mL/min/1.73

## 2022-09-01 LAB — LIPID PANEL
Chol/HDL Ratio: 3.4 ratio (ref 0.0–5.0)
Cholesterol, Total: 128 mg/dL (ref 100–199)
HDL: 38 mg/dL — ABNORMAL LOW
LDL Chol Calc (NIH): 69 mg/dL (ref 0–99)
Triglycerides: 117 mg/dL (ref 0–149)
VLDL Cholesterol Cal: 21 mg/dL (ref 5–40)

## 2022-09-16 LAB — BASIC METABOLIC PANEL WITH GFR
BUN/Creatinine Ratio: 13 (ref 10–24)
BUN: 21 mg/dL (ref 8–27)
CO2: 24 mmol/L (ref 20–29)
Calcium: 9.1 mg/dL (ref 8.6–10.2)
Chloride: 103 mmol/L (ref 96–106)
Creatinine, Ser: 1.61 mg/dL — ABNORMAL HIGH (ref 0.76–1.27)
Glucose: 111 mg/dL — ABNORMAL HIGH (ref 70–99)
Potassium: 4.8 mmol/L (ref 3.5–5.2)
Sodium: 142 mmol/L (ref 134–144)
eGFR: 47 mL/min/1.73 — ABNORMAL LOW

## 2022-09-18 ENCOUNTER — Telehealth: Payer: Self-pay

## 2022-09-18 DIAGNOSIS — Z79899 Other long term (current) drug therapy: Secondary | ICD-10-CM

## 2022-09-18 DIAGNOSIS — M7989 Other specified soft tissue disorders: Secondary | ICD-10-CM

## 2022-09-18 DIAGNOSIS — I10 Essential (primary) hypertension: Secondary | ICD-10-CM

## 2022-09-18 NOTE — Telephone Encounter (Signed)
-----   Message from Flossie Dibble, NP sent at 09/17/2022 11:45 AM EDT ----- Jerome Phelps, Repeat blood work shows a slight increase in how hard your kidneys are working.  How are you feeling since we started the Jardiance and stop the second dose of Lasix?  How much water are you drinking per day?  I do not want you to drink more than 64 ounces, but want to make sure you are staying hydrated enough as this can sometimes affect your kidney function.  I want to have you come back in 2 weeks so I can repeat a BMET (to let me look at your kidney function).   Best, Victorino Dike

## 2022-09-18 NOTE — Telephone Encounter (Signed)
Patient notified of results and recommendations and agreed with plan, lab order on file.  He stated with the new med changes he has notice improvement with sob however, as he feel the urge to urinate not much passes.He also said he is drinking plenty of water but I advise not to exceed 64 oz per your recommendation.  I message Jerome Phelps for further instructions if any.

## 2022-09-29 ENCOUNTER — Other Ambulatory Visit: Payer: Self-pay

## 2022-09-29 DIAGNOSIS — I10 Essential (primary) hypertension: Secondary | ICD-10-CM

## 2022-09-29 DIAGNOSIS — M7989 Other specified soft tissue disorders: Secondary | ICD-10-CM

## 2022-09-29 DIAGNOSIS — Z79899 Other long term (current) drug therapy: Secondary | ICD-10-CM

## 2022-09-30 LAB — BASIC METABOLIC PANEL WITH GFR
BUN/Creatinine Ratio: 18 (ref 10–24)
BUN: 21 mg/dL (ref 8–27)
CO2: 23 mmol/L (ref 20–29)
Calcium: 8.7 mg/dL (ref 8.6–10.2)
Chloride: 104 mmol/L (ref 96–106)
Creatinine, Ser: 1.18 mg/dL (ref 0.76–1.27)
Glucose: 85 mg/dL (ref 70–99)
Potassium: 4.7 mmol/L (ref 3.5–5.2)
Sodium: 141 mmol/L (ref 134–144)
eGFR: 69 mL/min/1.73

## 2022-10-10 ENCOUNTER — Ambulatory Visit: Payer: Managed Care, Other (non HMO)

## 2022-11-04 IMAGING — DX DG CHEST 2V
2 series · 2 of 2 positions shown · non-contrast
Comparison: July 13, 2010

CLINICAL DATA: Dyspnea on exertion.

EXAM:
CHEST - 2 VIEW

[chest pa]
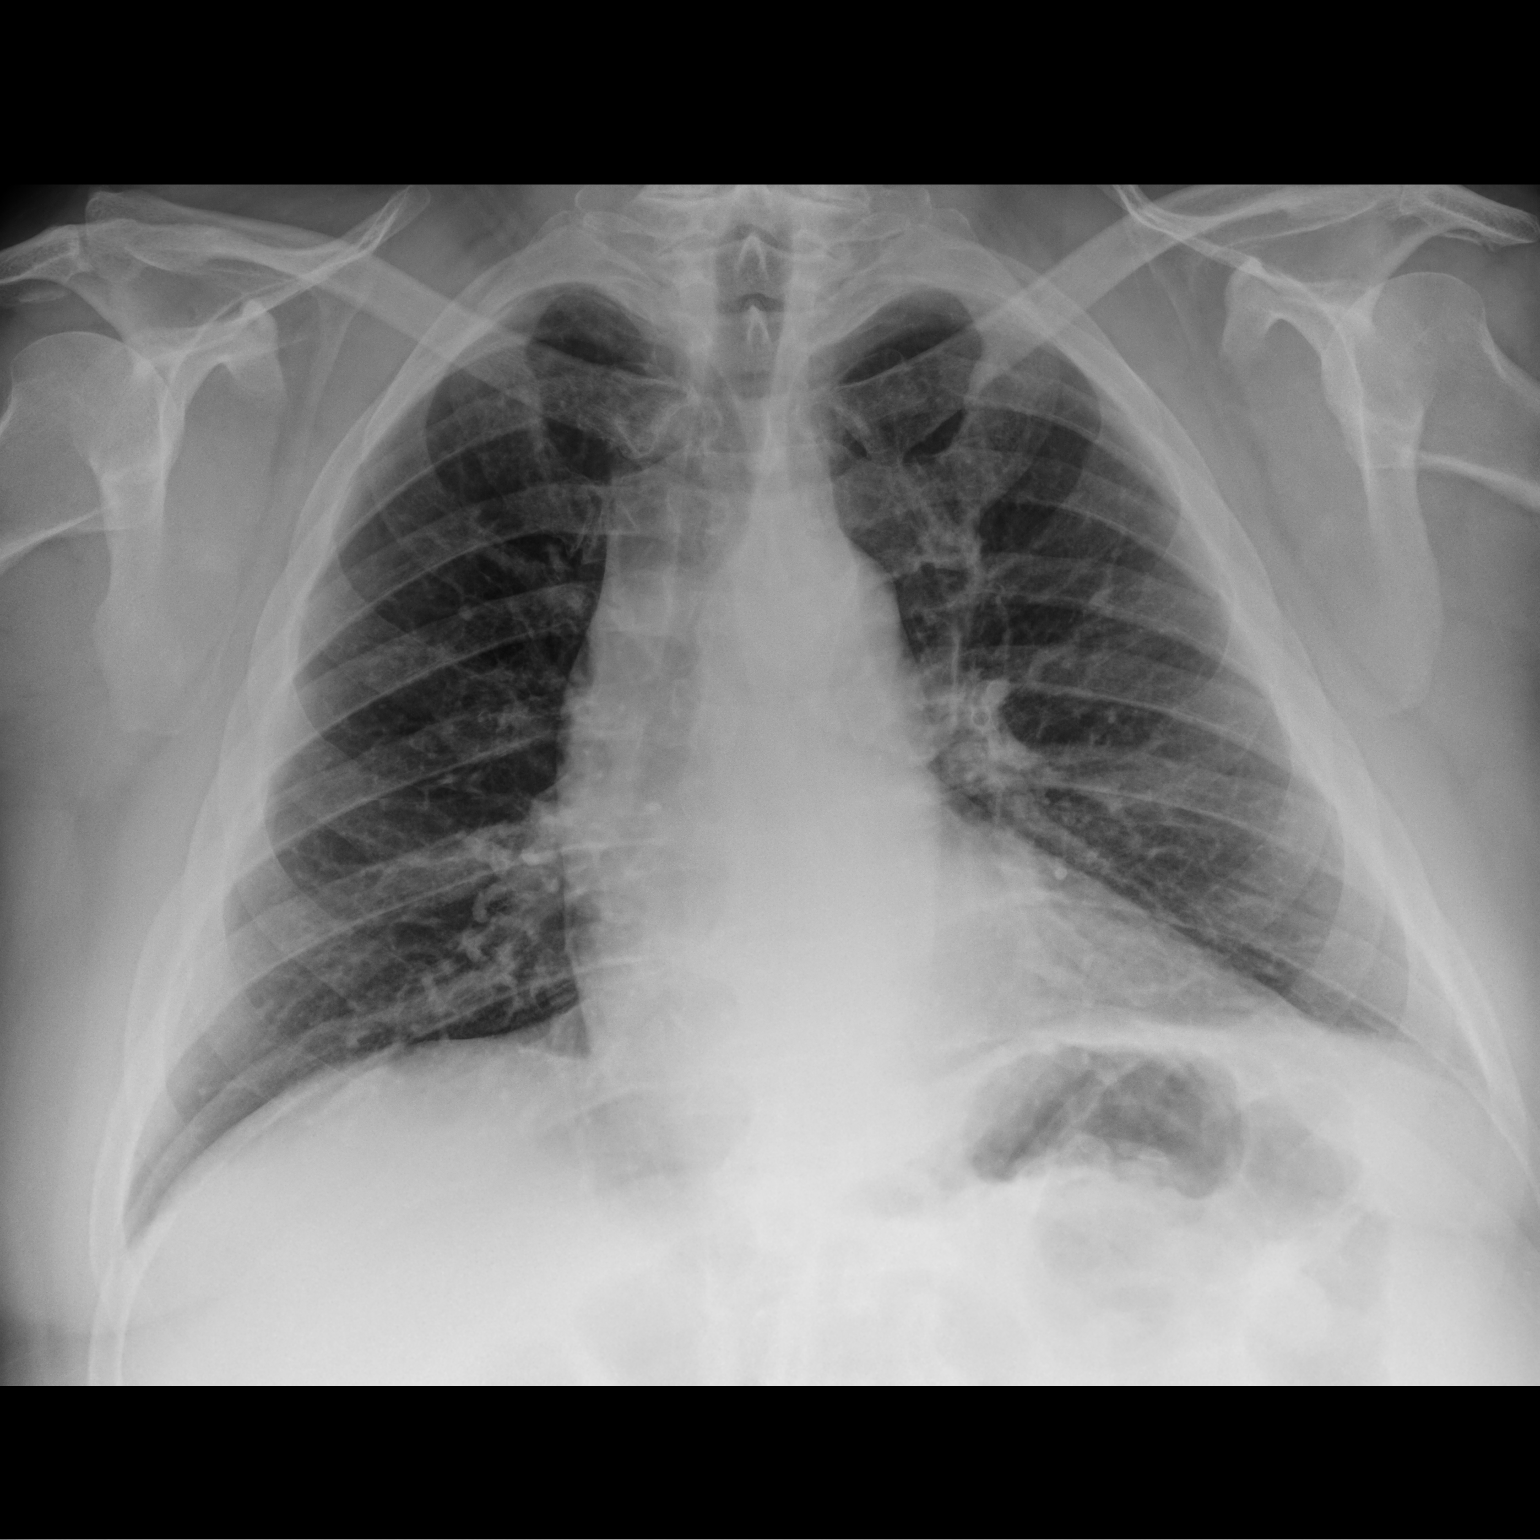

[chest lat]
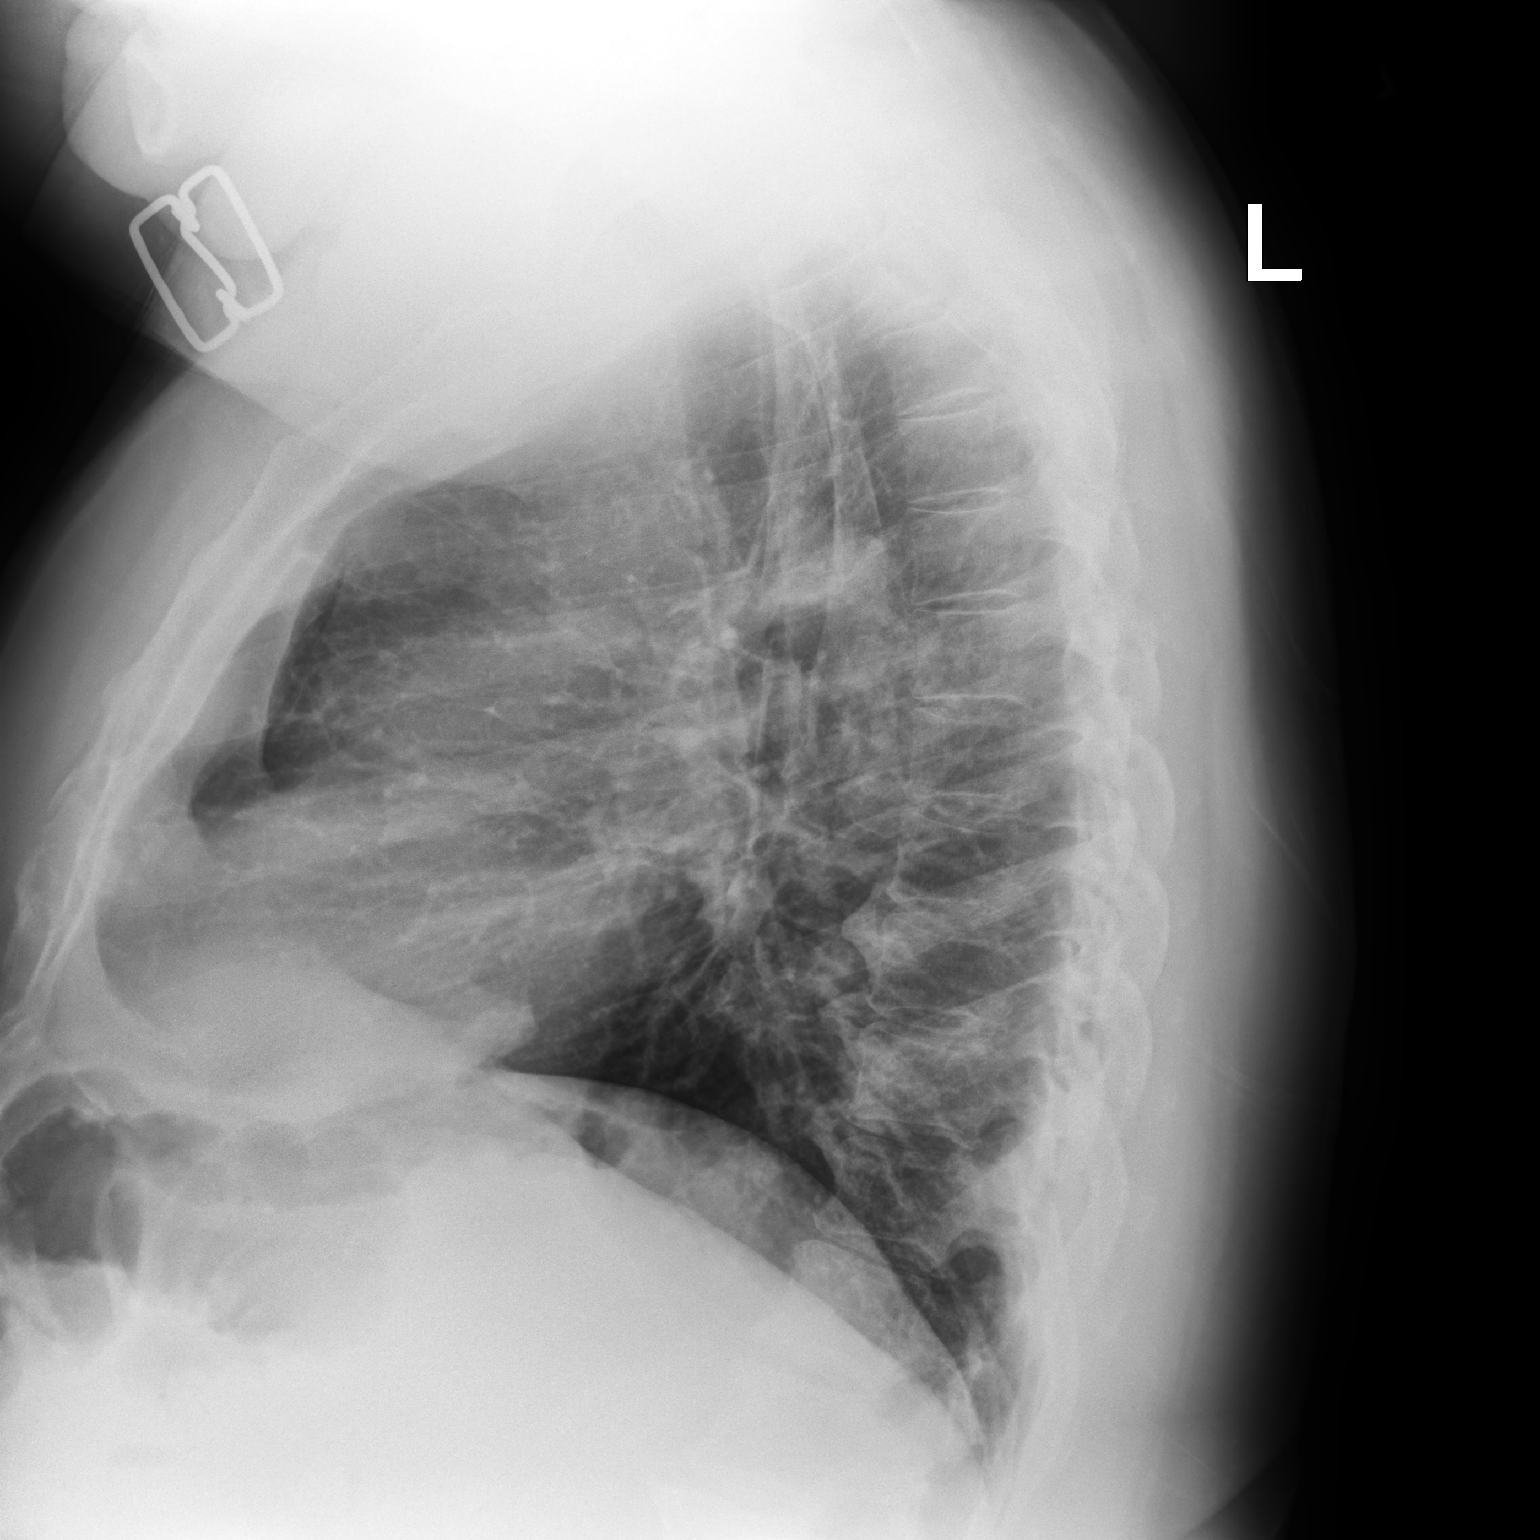

[2 of 2 positions shown; findings below may reference images not displayed]

FINDINGS: The heart size and mediastinal contours are within normal limits.
Low lung volumes are noted. Both lungs are clear. The visualized
skeletal structures are unremarkable.
IMPRESSION: No active cardiopulmonary disease.

## 2022-11-07 ENCOUNTER — Other Ambulatory Visit: Payer: Self-pay | Admitting: Physician Assistant

## 2022-11-29 NOTE — Progress Notes (Signed)
Cardiology Office Note:    Date:  11/30/2022   ID:  Jerome Phelps, DOB 08/23/1960, MRN [id number]  PCP:  Exie Parody   [facility] Providers Cardiologist:  Gypsy Balsam, MD     Referring MD: Rick Duff, PA-C   CC: follow up SOB  History of Present Illness:    Jerome Phelps is a 62 y.o. 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  Carotid ultrasound on 09/02/2021 revealed no stenosis bilaterally. 10/20/21 echo 55%, mild LVH 10/26/21 LHC  moderate diffuse CAD, no culprit lesion  He presented to [facility] with exertional chest pain and shortness of breath on 10/20/2021.  Echo at this time revealed an EF greater than 55%, mild concentric LVH, unable to exclude subtle RWMA, he had elevated troponin 0.04  > 1.3 > 5.99.  He was transferred to [facility].  LHC on 10/26/2021 LPAV lesion 50% stenosed, first marginal lesion 70% stenosed, moderate diffuse CAD.  Aggressive secondary prevention was recommended including statin therapy, and Plavix therapy x 1 year.   Most recently evaluated by Dr. Bing Matter on 07/21/2022 after recently following up with pulmonology for shortness of breath.  From their perspective, there was no explanation for her shortness of breath.  He was still complaining of SOB while walking.  His complaints were felt to be multifactorial related to obesity, diastolic dysfunction.  His Lasix was increased from 60 mg daily to 80 mg in the morning and 20 mg in the afternoon along with increasing his potassium supplementation.  Evaluated in April of this year for shortness of breath.  We started him on Jardiance to see if this would help.  He had been trying to lose weight without any success, had met  with his PCP however his insurance coverage did not cover any medications for weight loss  He presents today for follow-up of his CAD.  He took the [place] for a few weeks however had some lab work and his creatinine increased so he decided to stop it.  He did state that his breathing felt better while he was on this.  He is still bothered by his weight, and has been very difficult for him to lose weight, he is trying to eat correctly and avoid foods that are not healthy for him.  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 He denies chest pain, palpitations, dyspnea, pnd, orthopnea, n, v, dizziness, syncope, edema, weight gain, or early satiety.   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   4.3 cm   Baker's cyst 04/19/2021   Carotid artery occlusion    CHF (congestive heart failure) (HCC)    Chronic pain of both knees 05/29/2017   CKD (chronic kidney disease) stage 3, GFR 30-59 ml/min (HCC) 2024   Coronary artery disease 11/10/2016   Cardiac catheterization done in 2014 showing 20% LAD   Diastolic congestive heart failure (HCC)    Dyslipidemia 11/10/2016   Dyspnea on exertion 03/25/2021   Essential hypertension 08/22/2021   Hyperlipidemia    Low HDL (under 40) 03/24/2019   Lumbar radiculopathy  11/04/2013   Morbid obesity (HCC) 12/11/2016   Myocardial infarction (HCC)    10/21/22   NSTEMI (non-ST elevated myocardial infarction) (HCC) 10/21/2021   Overweight 10/27/2021   Peripheral neuropathy, idiopathic 11/04/2013   Pure hypercholesterolemia    S/P TKR (total knee replacement) using cement, left 09/11/2019   Sleep apnea    Umbilical hernia without obstruction and without gangrene 07/18/2022   Venous insufficiency     Past Surgical History:  Procedure Laterality Date    CORONARY ANGIOPLASTY     Right/Left   KNEE SURGERY Left    LEFT HEART CATH AND CORONARY ANGIOGRAPHY N/A 10/21/2021   Procedure: LEFT HEART CATH AND CORONARY ANGIOGRAPHY;  Surgeon: Corky Crafts, MD;  Location: [facility] INVASIVE CV LAB;  Service: Cardiovascular;  Laterality: N/A;    Current Medications: Current Meds  Medication Sig   aspirin EC 81 MG tablet Take 1 tablet (81 mg total) by mouth daily. Swallow whole.   carvedilol (COREG) 6.25 MG tablet TAKE ONE TABLET BY MOUTH TWICE DAILY WITH A MEAL   clopidogrel (PLAVIX) 75 MG tablet TAKE ONE TABLET BY MOUTH EVERY DAY   losartan (COZAAR) 25 MG tablet TAKE ONE TABLET BY MOUTH EVERY DAY   meloxicam (MOBIC) 15 MG tablet Take 15 mg by mouth daily.   multivitamin (ONE-A-DAY MEN'S) TABS tablet Take 1 tablet by mouth daily with breakfast.   potassium chloride SA (KLOR-CON M) 20 MEQ tablet Take 1 tablet (20 mEq total) by mouth daily.   Semaglutide-Weight Management 0.25 MG/0.5ML SOAJ Inject 0.25 mg into the skin once a week for 28 days.   [START ON 12/29/2022] Semaglutide-Weight Management 0.5 MG/0.5ML SOAJ Inject 0.5 mg into the skin once a week for 28 days.   [START ON 01/27/2023] Semaglutide-Weight Management 1 MG/0.5ML SOAJ Inject 1 mg into the skin once a week for 28 days.   [START ON 02/25/2023] Semaglutide-Weight Management 1.7 MG/0.75ML SOAJ Inject 1.7 mg into the skin once a week for 28 days.   [START ON 03/26/2023] Semaglutide-Weight Management 2.4 MG/0.75ML SOAJ Inject 2.4 mg into the skin once a week for 28 days.   [DISCONTINUED] furosemide (LASIX) 40 MG tablet Take 2 tablets (80 mg total) by mouth daily. Take 80 mg (2 tablets) in the morning     Allergies:   Atorvastatin   Social History   Socioeconomic History   Marital status: Married    Spouse name: Not on file   Number of children: 1   Years of education: Not on file   Highest education level: Not on file  Occupational History   Not on file  Tobacco Use   Smoking status:  Never   Smokeless tobacco: Never  Vaping Use   Vaping status: Never Used  Substance and Sexual Activity   Alcohol use: No   Drug use: No   Sexual activity: Not on file  Other Topics Concern   Not on file  Social History Narrative   Not on file   Social Determinants of Health   Financial Resource Strain: Not on file  Food Insecurity: Not on file  Transportation Needs: Not on file  Physical Activity: Not on file  Stress: Not on file  Social Connections: Unknown (09/18/2021)   Received from [facility]   Social Network    Social Network: Not on file     Family History: The patient's family history includes Lung cancer in his father; Thyroid cancer in his mother. There is no history of Colon cancer, Colon polyps, Esophageal cancer,  Rectal cancer, or Stomach cancer.  ROS:   Please see the history of present illness.     All other systems reviewed and are negative.  EKGs/Labs/Other Studies Reviewed:    The following studies were reviewed today: Cardiac Studies & Procedures   CARDIAC CATHETERIZATION  CARDIAC CATHETERIZATION 10/21/2021  Narrative   LPAV lesion is 50% stenosed.   1st Mrg lesion is 70% stenosed.   LV end diastolic pressure is severely elevated.  LVEDP 30 mmHg.   The left ventricular ejection fraction is 55-65% by visual estimate.   There is no aortic valve stenosis.   Moderate, diffuse coronary disease noted throughout the left system, most prominently in the LAD.  Mildly elevated troponin done at [facility].  No obvious culprit lesion.  His LVEDP is very high.  He has some diffuse, branch vessel lesions.  This may have been demand ischemia, which is consistent with his normal EF noted by echo and by cath.  Diurese aggressively and try to optimize medications.  He will need aggressive secondary prevention including statin therapy.  Clopidogrel also started in the Cath Lab due to elevated troponin.  Findings Coronary Findings Diagnostic  Dominance:  Left  Left Anterior Descending There is moderate diffuse disease throughout the vessel.  Left Circumflex There is mild diffuse disease throughout the vessel.  First Obtuse Marginal Branch 1st Mrg lesion is 70% stenosed. The lesion is irregular.  Left Posterior Atrioventricular Artery LPAV lesion is 50% stenosed.  Intervention  No interventions have been documented.   STRESS TESTS  MYOCARDIAL PERFUSION IMAGING 06/25/2019  Narrative  Nuclear stress EF: 63%.  The left ventricular ejection fraction is normal (55-65%).  There was no ST segment deviation noted during stress.  No T wave inversion was noted during stress.  The study is normal.  This is a low risk study.  Low risk stress nuclear study with normal perfusion and normal left ventricular regional and global systolic function.   ECHOCARDIOGRAM  ECHOCARDIOGRAM COMPLETE 04/15/2021  Narrative ECHOCARDIOGRAM REPORT    Patient Name:   Alessandro Hrdlicka Date of Exam: 04/15/2021 Medical Rec #:  [id number]    Height:       73.0 in Accession #:    [id number]   Weight:       369.8 lb Date of Birth:  1961-03-18     BSA:          2.794 m Patient Age:    60 years     BP:           136/84 mmHg Patient Gender: M            HR:           81 bpm. Exam Location:  [place]  Procedure: 2D Echo, Cardiac Doppler, Color Doppler and Strain Analysis  Indications:    Dyspnea R06.00  History:        Patient has no prior history of Echocardiogram examinations. CAD; Signs/Symptoms:Swelling of both lower extremities.  Sonographer:    Louie Boston RDCS Referring Phys: [id number] Georgeanna Lea   Sonographer Comments: Technically challenging study due to limited acoustic windows. IMPRESSIONS   1. Left ventricular ejection fraction, by estimation, is 50 to 55%. The left ventricle has low normal function. Left ventricular endocardial border not optimally defined to evaluate regional wall motion. There is moderate concentric left  ventricular hypertrophy. Left ventricular diastolic parameters are consistent with Grade I diastolic dysfunction (impaired relaxation). 2. Right ventricular systolic function is normal. The right ventricular size  is normal. Tricuspid regurgitation signal is inadequate for assessing PA pressure. 3. The mitral valve is normal in structure. No evidence of mitral valve regurgitation. No evidence of mitral stenosis. 4. The aortic valve is tricuspid. Aortic valve regurgitation is not visualized. No aortic stenosis is present. 5. There is moderate dilatation of the ascending aorta, measuring 40 mm.  FINDINGS Left Ventricle: Left ventricular ejection fraction, by estimation, is 50 to 55%. The left ventricle has low normal function. Left ventricular endocardial border not optimally defined to evaluate regional wall motion. Global longitudinal strain performed but not reported based on interpreter judgement due to suboptimal tracking. The left ventricular internal cavity size was normal in size. There is moderate concentric left ventricular hypertrophy. Left ventricular diastolic parameters are consistent with Grade I diastolic dysfunction (impaired relaxation). Indeterminate filling pressures.  Right Ventricle: The right ventricular size is normal. No increase in right ventricular wall thickness. Right ventricular systolic function is normal. Tricuspid regurgitation signal is inadequate for assessing PA pressure. The tricuspid regurgitant velocity is 1.66 m/s, and with an assumed right atrial pressure of 3 mmHg, the estimated right ventricular systolic pressure is 14.0 mmHg.  Left Atrium: Left atrial size was normal in size.  Right Atrium: Right atrial size was normal in size.  Pericardium: There is no evidence of pericardial effusion.  Mitral Valve: The mitral valve is normal in structure. No evidence of mitral valve regurgitation. No evidence of mitral valve stenosis.  Tricuspid Valve: The tricuspid  valve is normal in structure. Tricuspid valve regurgitation is trivial. No evidence of tricuspid stenosis.  Aortic Valve: The aortic valve is tricuspid. Aortic valve regurgitation is not visualized. No aortic stenosis is present.  Pulmonic Valve: The pulmonic valve was normal in structure. Pulmonic valve regurgitation is trivial. No evidence of pulmonic stenosis.  Aorta: The aortic root is normal in size and structure and the aortic arch was not well visualized. There is moderate dilatation of the ascending aorta, measuring 40 mm.  Venous: A normal flow pattern is recorded from the right upper pulmonary vein. The inferior vena cava was not well visualized.  IAS/Shunts: No atrial level shunt detected by color flow Doppler.   LEFT VENTRICLE PLAX 2D LVIDd:         4.00 cm   Diastology LVIDs:         3.10 cm   LV e' medial:    5.87 cm/s LV PW:         1.40 cm   LV E/e' medial:  16.1 LV IVS:        1.40 cm   LV e' lateral:   7.51 cm/s LVOT diam:     2.50 cm   LV E/e' lateral: 12.6 LV SV:         121 LV SV Index:   43 LVOT Area:     4.91 cm   RIGHT VENTRICLE RV S prime:     15.20 cm/s TAPSE (M-mode): 4.0 cm  LEFT ATRIUM             Index        RIGHT ATRIUM           Index LA diam:        3.90 cm 1.40 cm/m   RA Area:     18.40 cm LA Vol (A2C):   78.1 ml 27.95 ml/m  RA Volume:   52.90 ml  18.93 ml/m LA Vol (A4C):   82.8 ml 29.64 ml/m LA Biplane Vol: 81.6 ml 29.21  ml/m AORTIC VALVE LVOT Vmax:   105.00 cm/s LVOT Vmean:  79.000 cm/s LVOT VTI:    0.246 m  AORTA Ao Root diam: 3.60 cm Ao Asc diam:  4.05 cm  MITRAL VALVE               TRICUSPID VALVE MV Area (PHT): 3.48 cm    TR Peak grad:   11.0 mmHg MV Decel Time: 218 msec    TR Vmax:        166.00 cm/s MV E velocity: 94.70 cm/s MV A velocity: 78.00 cm/s  SHUNTS MV E/A ratio:  1.21        Systemic VTI:  0.25 m Systemic Diam: 2.50 cm  Norman Herrlich MD Electronically signed by Norman Herrlich MD Signature Date/Time:  04/17/2021/9:50:49 AM    Final              EKG:  EKG is not ordered today.    Recent Labs: 04/20/2022: NT-Pro BNP 58 08/31/2022: ALT 16 09/29/2022: BUN 21; Creatinine, Ser 1.18; Potassium 4.7; Sodium 141  Recent Lipid Panel    Component Value Date/Time   CHOL 128 08/31/2022 0850   TRIG 117 08/31/2022 0850   HDL 38 (L) 08/31/2022 0850   CHOLHDL 3.4 08/31/2022 0850   CHOLHDL 3.8 10/23/2021 0930   VLDL 31 10/23/2021 0930   LDLCALC 69 08/31/2022 0850     Risk Assessment/Calculations:             Physical Exam:    VS:  BP 124/84 (BP Location: Right Arm, Patient Position: Sitting, Cuff Size: Normal)   Pulse 72   Ht 6\' 1"  (1.854 m)   Wt (!) 363 lb 12.8 oz (165 kg)   SpO2 96%   BMI 48.00 kg/m     Wt Readings from Last 3 Encounters:  11/30/22 (!) 363 lb 12.8 oz (165 kg)  08/31/22 (!) 360 lb (163.3 kg)  08/14/22 (!) 357 lb (161.9 kg)     GEN:  Well nourished, well developed in no acute distress HEENT: Normal NECK: No JVD; No carotid bruits LYMPHATICS: No lymphadenopathy CARDIAC: RRR, no murmurs, rubs, gallops RESPIRATORY:  Clear to auscultation without rales, wheezing or rhonchi  ABDOMEN: Soft, non-tender, non-distended MUSCULOSKELETAL:  No edema; No deformity  SKIN: Warm and dry NEUROLOGIC:  Alert and oriented x 3 PSYCHIATRIC:  Normal affect   ASSESSMENT:    1. Coronary artery disease involving native coronary artery of native heart without angina pectoris   2. Chronic diastolic congestive heart failure (HCC)   3. BMI 45.0-49.9, adult (HCC)   4. Dyslipidemia   5. Dyspnea on exertion     PLAN:    In order of problems listed above:  CAD -most recent LHC in 2023 showed moderate diffuse CAD, continue aspirin 81 mg daily.  Continue Coreg 6.25 mg twice daily he was previously on Plavix and a statin however these were discontinued to see if they were contributing to his shortness of breath.  He does feel slightly better since stopping them. Stable with no  anginal symptoms. No indication for ischemic evaluation.    Obesity with a BMI of 48-he has tried multiple diets as well as exercising to the best of his ability and has not been able to lose greater than 5% of his body weight, in fact he has gained a few pounds recently.  We reviewed his dietary intake in the office today he is trying to adhere to a low calorie high-fiber diet however the weight does not  appear to be moving.  This is placing him at a higher risk for future coronary events and obesity is directly affecting this as well. Will see if Reginal Lutes would be covered to help augment his weight loss and further reduce his risk of coronary events.   DOE - Previous workup with pulmonology was unrevealing.  We started him on Jardiance previously, he felt better however stopped it as his kidney function increase slightly.  We will restart his Jardiance and decrease his Lasix to 40 mg in the evening.  Diastolic HF - NYHA class II--although not clear if this is related to his weight, deconditioning. Continue lasix but decrease to 40 mg daily,  restart Jardiance 10 mg daily.  Repeat BMET in 2 weeks.  HLD -lipoprotein a 34.7, most recent LDL on 08/31/22 was controlled at 69, was previously on a statin however this was stopped to see if it would help with the shortness of breath some.  He does feel slightly better off of his statin.  HTN -blood pressure is well-controlled today at 124/84, continue Coreg 6.25 mg twice daily, continue losartan 25 mg daily.  OSA - compliant with CPAP.      Disposition -repeat BMET in 2 weeks, start Jardiance, start Wegovy.  Return in 3 months      Medication Adjustments/Labs and Tests Ordered: Current medicines are reviewed at length with the patient today.  Concerns regarding medicines are outlined above.  Orders Placed This Encounter  Procedures   Basic metabolic panel   Basic metabolic panel   Meds ordered this encounter  Medications   Semaglutide-Weight  Management 0.25 MG/0.5ML SOAJ    Sig: Inject 0.25 mg into the skin once a week for 28 days.    Dispense:  2 mL    Refill:  0    Order Specific Question:   Supervising Provider    Answer:   Georgeanna Lea [[id number]]   Semaglutide-Weight Management 0.5 MG/0.5ML SOAJ    Sig: Inject 0.5 mg into the skin once a week for 28 days.    Dispense:  2 mL    Refill:  0    Order Specific Question:   Supervising Provider    Answer:   Georgeanna Lea [[id number]]   Semaglutide-Weight Management 1 MG/0.5ML SOAJ    Sig: Inject 1 mg into the skin once a week for 28 days.    Dispense:  2 mL    Refill:  0    Order Specific Question:   Supervising Provider    Answer:   Georgeanna Lea [[id number]]   Semaglutide-Weight Management 1.7 MG/0.75ML SOAJ    Sig: Inject 1.7 mg into the skin once a week for 28 days.    Dispense:  3 mL    Refill:  0    Order Specific Question:   Supervising Provider    Answer:   Georgeanna Lea [[id number]]   Semaglutide-Weight Management 2.4 MG/0.75ML SOAJ    Sig: Inject 2.4 mg into the skin once a week for 28 days.    Dispense:  3 mL    Refill:  0    Order Specific Question:   Supervising Provider    Answer:   Georgeanna Lea [[id number]]   furosemide (LASIX) 40 MG tablet    Sig: Take 1 tablet (40 mg total) by mouth daily. May take extra dose in the evening for shortness of breath.    Dispense:  90 tablet    Refill:  3   empagliflozin (JARDIANCE)  10 MG TABS tablet    Sig: Take 1 tablet (10 mg total) by mouth daily before breakfast.    Dispense:  90 tablet    Refill:  3    Patient Instructions  Medication Instructions:  Your physician has recommended you make the following change in your medication:   Decrease your Lasix to 40 mg daily, may take extra dose at the end of the day for shortness of breath.  Restart [facility] [place]  *If you need a refill on your cardiac medications before your next appointment, please call your pharmacy*   Lab Work: Your  physician recommends that you have a BMP done today.  Your physician recommends that you return for lab work in: 2 weeks for BMP. You can come Monday through Friday 8:30 am to 12:00 pm and 1:15 to 4:30. You do not need to make an appointment as the order has already been placed.   If you have labs (blood work) drawn today and your tests are completely normal, you will receive your results only by: MyChart Message (if you have MyChart) OR A paper copy in the mail If you have any lab test that is abnormal or we need to change your treatment, we will call you to review the results.   Testing/Procedures: None ordered   Follow-Up: At [facility], you and your health needs are our priority.  As part of our continuing mission to provide you with exceptional heart care, we have created designated Provider Care Teams.  These Care Teams include your primary Cardiologist (physician) and Advanced Practice Providers (APPs -  Physician Assistants and Nurse Practitioners) who all work together to provide you with the care you need, when you need it.  We recommend signing up for the patient portal called "MyChart".  Sign up information is provided on this After Visit Summary.  MyChart is used to connect with patients for Virtual Visits (Telemedicine).  Patients are able to view lab/test results, encounter notes, upcoming appointments, etc.  Non-urgent messages can be sent to your provider as well.   To learn more about what you can do with MyChart, go to [email or web address].    Your next appointment:   3 month(s)  The format for your next appointment:   In Person  Provider:   Wallis Bamberg, NP [facility])    Other Instructions none  Important Information About Sugar        Signed, Flossie Dibble, NP  11/30/2022 1:04 PM    [facility]

## 2022-11-30 ENCOUNTER — Telehealth: Payer: Self-pay

## 2022-11-30 ENCOUNTER — Encounter: Payer: Self-pay | Admitting: Cardiology

## 2022-11-30 ENCOUNTER — Ambulatory Visit: Payer: Managed Care, Other (non HMO) | Attending: Cardiology | Admitting: Cardiology

## 2022-11-30 ENCOUNTER — Other Ambulatory Visit (HOSPITAL_COMMUNITY): Payer: Self-pay

## 2022-11-30 VITALS — BP 124/84 | HR 72 | Ht 73.0 in | Wt 363.8 lb

## 2022-11-30 DIAGNOSIS — E785 Hyperlipidemia, unspecified: Secondary | ICD-10-CM | POA: Diagnosis not present

## 2022-11-30 DIAGNOSIS — I2510 Atherosclerotic heart disease of native coronary artery without angina pectoris: Secondary | ICD-10-CM

## 2022-11-30 DIAGNOSIS — R0609 Other forms of dyspnea: Secondary | ICD-10-CM

## 2022-11-30 DIAGNOSIS — I5032 Chronic diastolic (congestive) heart failure: Secondary | ICD-10-CM

## 2022-11-30 DIAGNOSIS — Z6841 Body Mass Index (BMI) 40.0 and over, adult: Secondary | ICD-10-CM

## 2022-11-30 MED ORDER — SEMAGLUTIDE-WEIGHT MANAGEMENT 1 MG/0.5ML ~~LOC~~ SOAJ: 1.0000 mg | SUBCUTANEOUS | 0 refills | Status: AC

## 2022-11-30 MED ORDER — SEMAGLUTIDE-WEIGHT MANAGEMENT 1.7 MG/0.75ML ~~LOC~~ SOAJ: 1.7000 mg | SUBCUTANEOUS | 0 refills | Status: AC

## 2022-11-30 MED ORDER — SEMAGLUTIDE-WEIGHT MANAGEMENT 0.25 MG/0.5ML ~~LOC~~ SOAJ: 0.2500 mg | SUBCUTANEOUS | 0 refills | Status: AC

## 2022-11-30 MED ORDER — SEMAGLUTIDE-WEIGHT MANAGEMENT 2.4 MG/0.75ML ~~LOC~~ SOAJ: 2.4000 mg | SUBCUTANEOUS | 0 refills | Status: AC

## 2022-11-30 MED ORDER — EMPAGLIFLOZIN 10 MG PO TABS: 10.0000 mg | ORAL_TABLET | Freq: Every day | ORAL | 3 refills | Status: DC

## 2022-11-30 MED ORDER — SEMAGLUTIDE-WEIGHT MANAGEMENT 0.5 MG/0.5ML ~~LOC~~ SOAJ: 0.5000 mg | SUBCUTANEOUS | 0 refills | Status: AC

## 2022-11-30 MED ORDER — FUROSEMIDE 40 MG PO TABS: 40.0000 mg | ORAL_TABLET | Freq: Every day | ORAL | 3 refills | Status: AC

## 2022-11-30 NOTE — Telephone Encounter (Signed)
   Pts plan doesn't cover. The rx is a plan/benefit exclusion. Even with MI diagnosis. Please see the test claims attached.

## 2022-11-30 NOTE — Telephone Encounter (Signed)
Pt needs PA for [facility]. Thank you

## 2022-11-30 NOTE — Patient Instructions (Signed)
Medication Instructions:  Your physician has recommended you make the following change in your medication:   Decrease your Lasix to 40 mg daily, may take extra dose at the end of the day for shortness of breath.  Restart [facility] [place]  *If you need a refill on your cardiac medications before your next appointment, please call your pharmacy*   Lab Work: Your physician recommends that you have a BMP done today.  Your physician recommends that you return for lab work in: 2 weeks for BMP. You can come Monday through Friday 8:30 am to 12:00 pm and 1:15 to 4:30. You do not need to make an appointment as the order has already been placed.   If you have labs (blood work) drawn today and your tests are completely normal, you will receive your results only by: MyChart Message (if you have MyChart) OR A paper copy in the mail If you have any lab test that is abnormal or we need to change your treatment, we will call you to review the results.   Testing/Procedures: None ordered   Follow-Up: At [facility], you and your health needs are our priority.  As part of our continuing mission to provide you with exceptional heart care, we have created designated Provider Care Teams.  These Care Teams include your primary Cardiologist (physician) and Advanced Practice Providers (APPs -  Physician Assistants and Nurse Practitioners) who all work together to provide you with the care you need, when you need it.  We recommend signing up for the patient portal called "MyChart".  Sign up information is provided on this After Visit Summary.  MyChart is used to connect with patients for Virtual Visits (Telemedicine).  Patients are able to view lab/test results, encounter notes, upcoming appointments, etc.  Non-urgent messages can be sent to your provider as well.   To learn more about what you can do with MyChart, go to [email or web address].    Your next appointment:   3 month(s)  The  format for your next appointment:   In Person  Provider:   Wallis Bamberg, NP [facility])    Other Instructions none  Important Information About Sugar

## 2022-12-01 LAB — BASIC METABOLIC PANEL WITH GFR
BUN/Creatinine Ratio: 17 (ref 10–24)
BUN: 19 mg/dL (ref 8–27)
CO2: 27 mmol/L (ref 20–29)
Calcium: 8.6 mg/dL (ref 8.6–10.2)
Chloride: 104 mmol/L (ref 96–106)
Creatinine, Ser: 1.15 mg/dL (ref 0.76–1.27)
Glucose: 93 mg/dL (ref 70–99)
Potassium: 4.9 mmol/L (ref 3.5–5.2)
Sodium: 143 mmol/L (ref 134–144)
eGFR: 71 mL/min/1.73

## 2022-12-04 MED ORDER — EMPAGLIFLOZIN 10 MG PO TABS: 10.0000 mg | ORAL_TABLET | Freq: Every day | ORAL | 3 refills | Status: DC

## 2022-12-08 ENCOUNTER — Other Ambulatory Visit (HOSPITAL_COMMUNITY): Payer: Self-pay

## 2022-12-08 ENCOUNTER — Telehealth: Payer: Self-pay

## 2022-12-08 NOTE — Telephone Encounter (Signed)
Pharmacy Patient Advocate Encounter  Received notification from [employer] that Prior Authorization for JARDIANCE has been APPROVED from 11/08/22 to 12/08/23. Ran test claim, Copay is $45

## 2022-12-08 NOTE — Telephone Encounter (Signed)
PA request has been Submitted. New Encounter created for follow up. For additional info see Pharmacy Prior Auth telephone encounter from 12/08/22.

## 2022-12-08 NOTE — Telephone Encounter (Signed)
Patient notified

## 2022-12-08 NOTE — Telephone Encounter (Signed)
Pharmacy Patient Advocate Encounter   Received notification from Physician's Office that prior authorization for JARDIANCE is required/requested.   Insurance verification completed.   The patient is insured through [employer] .   Per test claim: PA required; PA submitted to [employer] via CoverMyMeds Key/confirmation #/EOC [id number] Status is pending

## 2022-12-15 ENCOUNTER — Telehealth: Payer: Self-pay

## 2022-12-15 LAB — BASIC METABOLIC PANEL WITH GFR
BUN/Creatinine Ratio: 15 (ref 10–24)
BUN: 21 mg/dL (ref 8–27)
CO2: 23 mmol/L (ref 20–29)
Calcium: 8.8 mg/dL (ref 8.6–10.2)
Chloride: 108 mmol/L — ABNORMAL HIGH (ref 96–106)
Creatinine, Ser: 1.39 mg/dL — ABNORMAL HIGH (ref 0.76–1.27)
Glucose: 87 mg/dL (ref 70–99)
Potassium: 4.6 mmol/L (ref 3.5–5.2)
Sodium: 143 mmol/L (ref 134–144)
eGFR: 57 mL/min/1.73 — ABNORMAL LOW

## 2022-12-15 NOTE — Telephone Encounter (Signed)
Left VM for pt to call back.

## 2022-12-15 NOTE — Telephone Encounter (Signed)
-----   Message from Flossie Dibble sent at 12/15/2022  8:15 AM EDT ----- Mr. Charron, Labs look ok, slight increase in kidney function, not as bad as before.  I think we should continue the Jardiance, especially if it is making you feel better.  Lets repeat BMET one more time in two weeks.  Make sure you are drinking enough--but not too much water--total liquid intake ~ 64 ounces.

## 2022-12-28 NOTE — Addendum Note (Signed)
Addended by: Flossie Dibble on: 12/28/2022 08:05 AM   Modules accepted: Orders

## 2022-12-29 LAB — BASIC METABOLIC PANEL WITH GFR
BUN/Creatinine Ratio: 13 (ref 10–24)
BUN: 15 mg/dL (ref 8–27)
CO2: 24 mmol/L (ref 20–29)
Calcium: 8.4 mg/dL — ABNORMAL LOW (ref 8.6–10.2)
Chloride: 106 mmol/L (ref 96–106)
Creatinine, Ser: 1.13 mg/dL (ref 0.76–1.27)
Glucose: 87 mg/dL (ref 70–99)
Potassium: 4.6 mmol/L (ref 3.5–5.2)
Sodium: 140 mmol/L (ref 134–144)
eGFR: 73 mL/min/1.73

## 2023-02-02 ENCOUNTER — Other Ambulatory Visit: Payer: Self-pay | Admitting: Cardiology

## 2023-02-02 ENCOUNTER — Telehealth: Payer: Self-pay | Admitting: Cardiology

## 2023-02-02 NOTE — Telephone Encounter (Signed)
Patient stated he was returning a staff call.  Patient noted he recently changed pharmacies to [facility] [phone] - [place], [place] - [address]

## 2023-02-02 NOTE — Telephone Encounter (Signed)
Pharmacy updated, message addressed.

## 2023-03-01 NOTE — Progress Notes (Signed)
Cardiology Office Note:    Date:  03/02/2023   ID:  Jerome Phelps, DOB [birth year], MRN [id number]  PCP:  Exie Parody   [facility] Providers Cardiologist:  Gypsy Balsam, MD     Referring MD: Rick Duff, PA-C   CC: follow up SOB  History of Present Illness:    Jerome Phelps is a 62 y.o. 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  Carotid ultrasound on 09/02/2021 revealed no stenosis bilaterally. 10/20/21 echo 55%, mild LVH 10/26/21 LHC  moderate diffuse CAD, no culprit lesion  He presented to [facility] with exertional chest pain and shortness of breath on 10/20/2021.  Echo at this time revealed an EF greater than 55%, mild concentric LVH, unable to exclude subtle RWMA, he had elevated troponin 0.04  > 1.3 > 5.99.  He was transferred to [facility]  LHC on 10/26/2021 LPAV lesion 50% stenosed, first marginal lesion 70% stenosed, moderate diffuse CAD.  Aggressive secondary prevention was recommended including statin therapy, and Plavix therapy x 1 year.   Most recently evaluated by Dr. Bing Matter on 07/21/2022 after recently following up with pulmonology for shortness of breath.  From their perspective, there was no explanation for her shortness of breath.  He was still complaining of SOB while walking.  His complaints were felt to be multifactorial related to obesity, diastolic dysfunction.  His Lasix was increased from 60 mg daily to 80 mg in the morning and 20 mg in the afternoon along with increasing his potassium supplementation.  Evaluated in April and July of this year for shortness of breath.  We started him on Jardiance to see if this would help.  We had tried to get him approved for [facility] from a  cardiac perspective for risk reduction for further episodes of MACE however his insurance denied this, his PCP is also trying to get him approved for [facility] however has not had success either.  He presents today for follow-up of his coronary artery disease and shortness of breath.  The shortness of breath continues to be bothersome for him at times.  Likely it is multifactorial related to obesity, some level of deconditioning.  He continues to struggle with weight loss.  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  His inability to lose weight is also compounded by right knee pain--he needs this replaced however his weight needs to be less than 300 pounds before the surgeon will consider this--so he is unable to adequately exercise. He denies chest pain, palpitations, pnd, orthopnea, n, v, dizziness, syncope, edema, weight gain, or early satiety.    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   4.3 cm   Baker's cyst 04/19/2021   Carotid artery occlusion    CHF (congestive heart failure) (HCC)    Chronic pain of both knees 05/29/2017   CKD (chronic kidney disease) stage 3, GFR 30-59 ml/min (HCC) 2024   Coronary artery disease 11/10/2016   Cardiac catheterization done in 2014 showing 20% LAD   Diastolic congestive heart failure (HCC)    Dyslipidemia 11/10/2016   Dyspnea on exertion 03/25/2021   Essential hypertension 08/22/2021   Hyperlipidemia    Low HDL (under 40) 03/24/2019   Lumbar radiculopathy 11/04/2013  Morbid obesity (HCC) 12/11/2016   Myocardial infarction (HCC)    10/21/22   NSTEMI (non-ST elevated myocardial infarction) (HCC) 10/21/2021   Overweight 10/27/2021   Peripheral neuropathy, idiopathic 11/04/2013   Pure hypercholesterolemia    S/P TKR (total knee replacement) using cement, 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 Swallow whole.   carvedilol (COREG) 6.25 MG tablet TAKE ONE TABLET BY MOUTH TWICE DAILY WITH A MEAL   clopidogrel (PLAVIX) 75 MG tablet TAKE ONE TABLET BY MOUTH EVERY DAY   empagliflozin (JARDIANCE) 10 MG TABS tablet Take 1 tablet (10 mg total) by mouth daily before breakfast.   furosemide (LASIX) 40 MG tablet Take 1 tablet (40 mg total) by mouth daily. May take extra dose in the evening for shortness of breath.   furosemide (LASIX) 40 MG tablet Take 40 mg by mouth daily as needed for edema.   losartan (COZAAR) 25 MG tablet TAKE ONE TABLET BY MOUTH EVERY DAY   meloxicam (MOBIC) 15 MG tablet Take 15 mg by mouth daily.   multivitamin (ONE-A-DAY MEN'S) TABS tablet Take 1 tablet by mouth daily with breakfast.   potassium chloride SA (KLOR-CON M) 20 MEQ tablet Take 20 mEq by mouth as needed (when taking Furosemide).   Semaglutide-Weight Management 1.7 MG/0.75ML SOAJ Inject 1.7 mg into the skin once a week for 28 days.   [START ON 03/26/2023] Semaglutide-Weight Management 2.4 MG/0.75ML SOAJ Inject 2.4 mg into the skin once a week for 28 days.     Allergies:   Atorvastatin   Social History   Socioeconomic History   Marital status: Married    Spouse name: Not on file   Number of children: 1   Years of education: Not on file   Highest education level: Not on file  Occupational History   Not on file  Tobacco Use   Smoking status: Never   Smokeless tobacco: Never  Vaping Use   Vaping status: Never Used  Substance and Sexual Activity   Alcohol  use: No   Drug use: No   Sexual activity: Not on file  Other Topics Concern   Not on file  Social History Narrative   Not on file   Social Determinants of Health   Financial Resource Strain: Not on file  Food Insecurity: Not on file  Transportation Needs: Not on file  Physical Activity: Not on file  Stress: Not on file  Social Connections: Unknown (09/18/2021)   Received from [facility]   Social Network    Social Network: Not on file     Family History: The patient's family history includes Lung cancer in his father; Thyroid cancer in his mother. There is no history of Colon cancer, Colon polyps, Esophageal cancer, Rectal cancer, or Stomach cancer.  ROS:   Please see the history of present illness.     All other systems reviewed and are negative.  EKGs/Labs/Other Studies Reviewed:    The following studies were reviewed today: Cardiac Studies & Procedures   CARDIAC CATHETERIZATION  CARDIAC CATHETERIZATION 10/21/2021  Narrative   LPAV lesion is 50% stenosed.   1st Mrg lesion is 70% stenosed.   LV end diastolic pressure is severely elevated.  LVEDP 30 mmHg.   The left ventricular ejection fraction is 55-65% by visual estimate.   There is no aortic valve stenosis.   Moderate, diffuse coronary disease noted throughout the left system, most prominently in the LAD.  Mildly elevated troponin done at [facility].  No obvious culprit lesion.  His LVEDP is very high.  He has some diffuse, branch vessel lesions.  This may have been demand ischemia, which is consistent with his normal EF noted by echo and by cath.  Diurese aggressively and try to optimize medications.  He will need aggressive secondary prevention including statin therapy.  Clopidogrel also started in the Cath Lab due to elevated troponin.  Findings Coronary Findings Diagnostic  Dominance: Left  Left Anterior Descending There is moderate diffuse disease throughout the vessel.  Left  Circumflex There is mild diffuse disease throughout the vessel.  First Obtuse Marginal Branch 1st Mrg lesion is 70% stenosed. The lesion is irregular.  Left Posterior Atrioventricular Artery LPAV lesion is 50% stenosed.  Intervention  No interventions have been documented.   STRESS TESTS  MYOCARDIAL PERFUSION IMAGING 06/25/2019  Narrative  Nuclear stress EF: 63%.  The left ventricular ejection fraction is normal (55-65%).  There was no ST segment deviation noted during stress.  No T wave inversion was noted during stress.  The study is normal.  This is a low risk study.  Low risk stress nuclear study with normal perfusion and normal left ventricular regional and global systolic function.   ECHOCARDIOGRAM  ECHOCARDIOGRAM COMPLETE 04/15/2021  Narrative ECHOCARDIOGRAM REPORT    Patient Name:   Angad Dudash Date of Exam: 04/15/2021 Medical Rec #:  [id number]    Height:       73.0 in Accession #:    [id number]   Weight:       369.8 lb Date of Birth:  06-19-1960     BSA:          2.794 m Patient Age:    60 years     BP:           136/84 mmHg Patient Gender: M            HR:           81 bpm. Exam Location:  [place]  Procedure: 2D Echo, Cardiac Doppler, Color Doppler and Strain Analysis  Indications:    Dyspnea R06.00  History:        Patient has no prior history of Echocardiogram examinations. CAD; Signs/Symptoms:Swelling of both lower extremities.  Sonographer:    Louie Boston RDCS Referring Phys: [id number] Georgeanna Lea   Sonographer Comments: Technically challenging study due to limited acoustic windows. IMPRESSIONS   1. Left ventricular ejection fraction, by estimation, is 50 to 55%. The left ventricle has low normal function. Left ventricular endocardial border not optimally defined to evaluate regional wall motion. There is moderate concentric left ventricular hypertrophy. Left ventricular diastolic parameters are consistent with Grade I diastolic  dysfunction (impaired relaxation). 2. Right ventricular systolic function is normal. The right ventricular size is normal. Tricuspid regurgitation signal is inadequate for assessing PA pressure. 3. The mitral valve is normal in structure. No evidence of mitral valve regurgitation. No evidence of  mitral stenosis. 4. The aortic valve is tricuspid. Aortic valve regurgitation is not visualized. No aortic stenosis is present. 5. There is moderate dilatation of the ascending aorta, measuring 40 mm.  FINDINGS Left Ventricle: Left ventricular ejection fraction, by estimation, is 50 to 55%. The left ventricle has low normal function. Left ventricular endocardial border not optimally defined to evaluate regional wall motion. Global longitudinal strain performed but not reported based on interpreter judgement due to suboptimal tracking. The left ventricular internal cavity size was normal in size. There is moderate concentric left ventricular hypertrophy. Left ventricular diastolic parameters are consistent with Grade I diastolic dysfunction (impaired relaxation). Indeterminate filling pressures.  Right Ventricle: The right ventricular size is normal. No increase in right ventricular wall thickness. Right ventricular systolic function is normal. Tricuspid regurgitation signal is inadequate for assessing PA pressure. The tricuspid regurgitant velocity is 1.66 m/s, and with an assumed right atrial pressure of 3 mmHg, the estimated right ventricular systolic pressure is 14.0 mmHg.  Left Atrium: Left atrial size was normal in size.  Right Atrium: Right atrial size was normal in size.  Pericardium: There is no evidence of pericardial effusion.  Mitral Valve: The mitral valve is normal in structure. No evidence of mitral valve regurgitation. No evidence of mitral valve stenosis.  Tricuspid Valve: The tricuspid valve is normal in structure. Tricuspid valve regurgitation is trivial. No evidence of tricuspid  stenosis.  Aortic Valve: The aortic valve is tricuspid. Aortic valve regurgitation is not visualized. No aortic stenosis is present.  Pulmonic Valve: The pulmonic valve was normal in structure. Pulmonic valve regurgitation is trivial. No evidence of pulmonic stenosis.  Aorta: The aortic root is normal in size and structure and the aortic arch was not well visualized. There is moderate dilatation of the ascending aorta, measuring 40 mm.  Venous: A normal flow pattern is recorded from the right upper pulmonary vein. The inferior vena cava was not well visualized.  IAS/Shunts: No atrial level shunt detected by color flow Doppler.   LEFT VENTRICLE PLAX 2D LVIDd:         4.00 cm   Diastology LVIDs:         3.10 cm   LV e' medial:    5.87 cm/s LV PW:         1.40 cm   LV E/e' medial:  16.1 LV IVS:        1.40 cm   LV e' lateral:   7.51 cm/s LVOT diam:     2.50 cm   LV E/e' lateral: 12.6 LV SV:         121 LV SV Index:   43 LVOT Area:     4.91 cm   RIGHT VENTRICLE RV S prime:     15.20 cm/s TAPSE (M-mode): 4.0 cm  LEFT ATRIUM             Index        RIGHT ATRIUM           Index LA diam:        3.90 cm 1.40 cm/m   RA Area:     18.40 cm LA Vol (A2C):   78.1 ml 27.95 ml/m  RA Volume:   52.90 ml  18.93 ml/m LA Vol (A4C):   82.8 ml 29.64 ml/m LA Biplane Vol: 81.6 ml 29.21 ml/m AORTIC VALVE LVOT Vmax:   105.00 cm/s LVOT Vmean:  79.000 cm/s LVOT VTI:    0.246 m  AORTA Ao Root diam: 3.60 cm  Ao Asc diam:  4.05 cm  MITRAL VALVE               TRICUSPID VALVE MV Area (PHT): 3.48 cm    TR Peak grad:   11.0 mmHg MV Decel Time: 218 msec    TR Vmax:        166.00 cm/s MV E velocity: 94.70 cm/s MV A velocity: 78.00 cm/s  SHUNTS MV E/A ratio:  1.21        Systemic VTI:  0.25 m Systemic Diam: 2.50 cm  Norman Herrlich MD Electronically signed by Norman Herrlich MD Signature Date/Time: 04/17/2021/9:50:49 AM    Final              EKG:  EKG is not ordered today.    Recent  Labs: 04/20/2022: NT-Pro BNP 58 08/31/2022: ALT 16 12/28/2022: BUN 15; Creatinine, Ser 1.13; Potassium 4.6; Sodium 140  Recent Lipid Panel    Component Value Date/Time   CHOL 128 08/31/2022 0850   TRIG 117 08/31/2022 0850   HDL 38 (L) 08/31/2022 0850   CHOLHDL 3.4 08/31/2022 0850   CHOLHDL 3.8 10/23/2021 0930   VLDL 31 10/23/2021 0930   LDLCALC 69 08/31/2022 0850     Risk Assessment/Calculations:             Physical Exam:    VS:  BP 135/80 (BP Location: Right Arm, Patient Position: Sitting, Cuff Size: Large)   Pulse 64   Ht 6\' 1"  (1.854 m)   Wt (!) 363 lb (164.7 kg)   SpO2 96%   BMI 47.89 kg/m     Wt Readings from Last 3 Encounters:  03/02/23 (!) 363 lb (164.7 kg)  11/30/22 (!) 363 lb 12.8 oz (165 kg)  08/31/22 (!) 360 lb (163.3 kg)     GEN:  Well nourished, well developed in no acute distress HEENT: Normal NECK: No JVD; No carotid bruits LYMPHATICS: No lymphadenopathy CARDIAC: RRR, no murmurs, rubs, gallops RESPIRATORY:  Clear to auscultation without rales, wheezing or rhonchi  ABDOMEN: Soft, non-tender, non-distended MUSCULOSKELETAL: Trace pedal edema; No deformity  SKIN: Warm and dry NEUROLOGIC:  Alert and oriented x 3 PSYCHIATRIC:  Normal affect   ASSESSMENT:    1. Coronary artery disease involving native coronary artery of native heart without angina pectoris   2. Chronic diastolic congestive heart failure (HCC)   3. BMI 45.0-49.9, adult (HCC)   4. Dyslipidemia   5. Dyspnea on exertion   6. Essential hypertension      PLAN:    In order of problems listed above:  CAD -most recent LHC in 2023 showed moderate diffuse CAD, continue aspirin 81 mg daily, Continue Coreg 6.25 mg twice daily, continue Plavix 75 mg daily  Obesity with a BMI of 48-he has tried multiple diets as well as exercising to the best of his ability and has not been able to lose greater than 5% of his body weight, in fact he has gained a few pounds recently.  We reviewed his  dietary intake in the office today he is trying to adhere to a low calorie high-fiber diet however the weight does not appear to be moving.  This is placing him at a higher risk for future coronary events and obesity is directly affecting this as well. We have attempted to get him approved for Wegovy x 2, however his insurance will not cover any weight loss medications even though it would be to help prevent MACE. Will refer to [facility].  DOE - Previous workup with pulmonology was unrevealing.  I think his weight is the contributing cause and he is inclined to agree.   Diastolic HF - NYHA class II--although not clear if this is related to his weight, deconditioning. Continue lasix but decrease to 40 mg daily,  continue Jardiance 10 mg daily.    HLD -lipoprotein a 34.7, most recent LDL on 08/31/22 was controlled at 69, was previously on a statin however this was stopped to see if it would help with the shortness of breath some.  He does feel slightly better off of his statin.  HTN -blood pressure is controlled at 135/80, continue Coreg 6.25 mg twice daily, continue losartan 25 mg daily.  OSA - compliant with CPAP.      Disposition - BMET today, refer to [facility]. Start walking ~ 10 min daily. Return in 6 months.       Medication Adjustments/Labs and Tests Ordered: Current medicines are reviewed at length with the patient today.  Concerns regarding medicines are outlined above.  Orders Placed This Encounter  Procedures   Basic Metabolic Panel (BMET)   Ambulatory referral to [facility]   No orders of the defined types were placed in this encounter.   Patient Instructions  Medication Instructions:     No changes   *If you need a refill on your cardiac medications before your next appointment, please call your pharmacy*   Lab Work:   BMET    If you have labs (blood work) drawn today and your tests are completely normal, you will receive  your results only by: MyChart Message (if you have MyChart) OR A paper copy in the mail If you have any lab test that is abnormal or we need to change your treatment, we will call you to review the results.   Testing/Procedures:    Follow-Up: At [facility], you and your health needs are our priority.  As part of our continuing mission to provide you with exceptional heart care, we have created designated Provider Care Teams.  These Care Teams include your primary Cardiologist (physician) and Advanced Practice Providers (APPs -  Physician Assistants and Nurse Practitioners) who all work together to provide you with the care you need, when you need it.  We recommend signing up for the patient portal called "MyChart".  Sign up information is provided on this After Visit Summary.  MyChart is used to connect with patients for Virtual Visits (Telemedicine).  Patients are able to view lab/test results, encounter notes, upcoming appointments, etc.  Non-urgent messages can be sent to your provider as well.   To learn more about what you can do with MyChart, go to [email or web address].    Your next appointment:   6 month(s)  Provider:   Wallis Bamberg, NP Rosalita Levan)    Other Instructions  Referring you to [facility]  -- Dr. Quillian Quince who specializes in obesity.  Start walking 10 min daily  Try to restrict total carbohydrates to < 80 total carbs     Signed, Flossie Dibble, NP  03/02/2023 8:19 AM    [facility]

## 2023-03-02 ENCOUNTER — Ambulatory Visit: Payer: Managed Care, Other (non HMO) | Attending: Cardiology | Admitting: Cardiology

## 2023-03-02 ENCOUNTER — Encounter: Payer: Self-pay | Admitting: Cardiology

## 2023-03-02 VITALS — BP 135/80 | HR 64 | Ht 73.0 in | Wt 363.0 lb

## 2023-03-02 DIAGNOSIS — I2510 Atherosclerotic heart disease of native coronary artery without angina pectoris: Secondary | ICD-10-CM | POA: Diagnosis not present

## 2023-03-02 DIAGNOSIS — I5032 Chronic diastolic (congestive) heart failure: Secondary | ICD-10-CM

## 2023-03-02 DIAGNOSIS — R0609 Other forms of dyspnea: Secondary | ICD-10-CM

## 2023-03-02 DIAGNOSIS — Z79899 Other long term (current) drug therapy: Secondary | ICD-10-CM

## 2023-03-02 DIAGNOSIS — I10 Essential (primary) hypertension: Secondary | ICD-10-CM

## 2023-03-02 DIAGNOSIS — Z6841 Body Mass Index (BMI) 40.0 and over, adult: Secondary | ICD-10-CM | POA: Diagnosis not present

## 2023-03-02 DIAGNOSIS — E785 Hyperlipidemia, unspecified: Secondary | ICD-10-CM | POA: Diagnosis not present

## 2023-03-02 NOTE — Patient Instructions (Signed)
Medication Instructions:     No changes   *If you need a refill on your cardiac medications before your next appointment, please call your pharmacy*   Lab Work:   BMET    If you have labs (blood work) drawn today and your tests are completely normal, you will receive your results only by: MyChart Message (if you have MyChart) OR A paper copy in the mail If you have any lab test that is abnormal or we need to change your treatment, we will call you to review the results.   Testing/Procedures:    Follow-Up: At [facility], you and your health needs are our priority.  As part of our continuing mission to provide you with exceptional heart care, we have created designated Provider Care Teams.  These Care Teams include your primary Cardiologist (physician) and Advanced Practice Providers (APPs -  Physician Assistants and Nurse Practitioners) who all work together to provide you with the care you need, when you need it.  We recommend signing up for the patient portal called "MyChart".  Sign up information is provided on this After Visit Summary.  MyChart is used to connect with patients for Virtual Visits (Telemedicine).  Patients are able to view lab/test results, encounter notes, upcoming appointments, etc.  Non-urgent messages can be sent to your provider as well.   To learn more about what you can do with MyChart, go to [email or web address].    Your next appointment:   6 month(s)  Provider:   Wallis Bamberg, NP Rosalita Levan)    Other Instructions  Referring you to [facility]  -- Dr. Quillian Quince who specializes in obesity.  Start walking 10 min daily  Try to restrict total carbohydrates to < 80 total carbs

## 2023-03-03 LAB — BASIC METABOLIC PANEL WITH GFR
BUN/Creatinine Ratio: 11 (ref 10–24)
BUN: 14 mg/dL (ref 8–27)
CO2: 26 mmol/L (ref 20–29)
Calcium: 8.7 mg/dL (ref 8.6–10.2)
Chloride: 105 mmol/L (ref 96–106)
Creatinine, Ser: 1.24 mg/dL (ref 0.76–1.27)
Glucose: 72 mg/dL (ref 70–99)
Potassium: 4.7 mmol/L (ref 3.5–5.2)
Sodium: 141 mmol/L (ref 134–144)
eGFR: 65 mL/min/1.73

## 2023-03-16 ENCOUNTER — Other Ambulatory Visit: Payer: Self-pay | Admitting: Cardiology

## 2023-03-23 ENCOUNTER — Other Ambulatory Visit: Payer: Self-pay | Admitting: Cardiology

## 2023-04-16 ENCOUNTER — Telehealth: Payer: Self-pay | Admitting: Cardiology

## 2023-04-16 NOTE — Telephone Encounter (Signed)
Pt reports that his shortness of breath is getting worse and no better. Pt has an appointment with Dr. Vincent Gros as Dr. Bing Matter is booked out. Pt verbalized understanding and had no additional questions.

## 2023-04-16 NOTE — Telephone Encounter (Signed)
Pt c/o Shortness Of Breath: STAT if SOB developed within the last 24 hours or pt is noticeably SOB on the phone  1. Are you currently SOB (can you hear that pt is SOB on the phone)?   No  2. How long have you been experiencing SOB?   Patient stated has had for about a year but in now getting worse  3. Are you SOB when sitting or when up moving around?   When up and moving around  4. Are you currently experiencing any other symptoms?  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

## 2023-04-20 ENCOUNTER — Ambulatory Visit: Payer: Managed Care, Other (non HMO)

## 2023-04-20 VITALS — BP 136/80 | HR 77 | Ht 73.0 in | Wt 365.8 lb

## 2023-04-20 DIAGNOSIS — E7800 Pure hypercholesterolemia, unspecified: Secondary | ICD-10-CM | POA: Diagnosis not present

## 2023-04-20 DIAGNOSIS — I10 Essential (primary) hypertension: Secondary | ICD-10-CM | POA: Diagnosis not present

## 2023-04-20 DIAGNOSIS — R0609 Other forms of dyspnea: Secondary | ICD-10-CM | POA: Diagnosis not present

## 2023-04-20 DIAGNOSIS — I25118 Atherosclerotic heart disease of native coronary artery with other forms of angina pectoris: Secondary | ICD-10-CM | POA: Diagnosis not present

## 2023-04-20 MED ORDER — AMLODIPINE BESYLATE 2.5 MG PO TABS: 2.5000 mg | ORAL_TABLET | Freq: Every day | ORAL | 3 refills | Status: DC

## 2023-04-20 MED ORDER — ROSUVASTATIN CALCIUM 5 MG PO TABS
5.0000 mg | ORAL_TABLET | Freq: Every day | ORAL | 3 refills | Status: DC
Start: 2023-04-20 — End: 2023-07-26

## 2023-04-20 NOTE — Patient Instructions (Signed)
Medication Instructions:  Start taking Amlodipine 2.5 mg once a day. Start taking Crestor 5 mg once a day.  *If you need a refill on your cardiac medications before your next appointment, please call your pharmacy*   Lab Work: None Ordered If you have labs (blood work) drawn today and your tests are completely normal, you will receive your results only by: MyChart Message (if you have MyChart) OR A paper copy in the mail If you have any lab test that is abnormal or we need to change your treatment, we will call you to review the results.   Testing/Procedures: None Ordered   Follow-Up: At [facility], you and your health needs are our priority.  As part of our continuing mission to provide you with exceptional heart care, we have created designated Provider Care Teams.  These Care Teams include your primary Cardiologist (physician) and Advanced Practice Providers (APPs -  Physician Assistants and Nurse Practitioners) who all work together to provide you with the care you need, when you need it.  We recommend signing up for the patient portal called "MyChart".  Sign up information is provided on this After Visit Summary.  MyChart is used to connect with patients for Virtual Visits (Telemedicine).  Patients are able to view lab/test results, encounter notes, upcoming appointments, etc.  Non-urgent messages can be sent to your provider as well.   To learn more about what you can do with MyChart, go to [email or web address].    Your next appointment:   3 month follow up with Dr. Vincent Gros

## 2023-04-20 NOTE — Assessment & Plan Note (Signed)
Not on any lipid-lowering therapy. Does have risk factors in the form of CAD. Mild intolerance to atorvastatin in the past. Willing to try Crestor. Will prescribe 5 mg Crestor once a day. Follow-up in 3 months for interval check on tolerance and if doing well we will check liver function test.

## 2023-04-20 NOTE — Assessment & Plan Note (Signed)
Multifactorial in the setting of morbid obesity, deconditioning, underlying CAD.  Continue optimizing CAD management for any underlying angina as above with amlodipine, carvedilol and statin therapy.  Advised about dietary modifications with calorie restriction, discontinued monitoring your consumption and targeting weight loss.  He unfortunately was not approved for GLP P1 antagonist.  Will consider seeing if this can be approved at subsequent follow-up visits with the onset of new calendar year.

## 2023-04-20 NOTE — Progress Notes (Signed)
Cardiology Consultation:    Date:  04/20/2023   ID:  Sameul Phelps, DOB [birth year], MRN [id number]  PCP:  Rick Duff, PA-C  Cardiologist:  Marlyn Corporal Robyne Matar, MD   Referring MD: Rick Duff, PA-C   Chief Complaint  Patient presents with   Shortness of Breath     ASSESSMENT AND PLAN:   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  Problem List Items Addressed This Visit     Coronary artery disease   Last cardiac catheterization June 2023 at [facility] with moderate diffuse disease, 70% lesion of obtuse marginal and 50% lesion of PLB, in the setting of NSTEMI, felt to have no obvious culprit lesion and recommended to continue with medical therapy.  LVEDP was also noted to be elevated.  Continue aspirin 81 mg once daily Continue Plavix 75 mg once daily as he remains to be tolerating dual antiplatelet therapy well without any side effects. Given his ongoing atypical symptoms we will continue dual antiplatelet therapy so long as he tolerates it without side effects.        Relevant Medications   amLODipine (NORVASC) 2.5 MG tablet   rosuvastatin (CRESTOR) 5 MG tablet   Dyspnea on exertion   Multifactorial in the setting of morbid obesity, deconditioning, underlying CAD.  Continue optimizing CAD management for any underlying angina as above with amlodipine, carvedilol and statin therapy.  Advised about dietary modifications with calorie restriction, discontinued monitoring your consumption and targeting weight loss.  He unfortunately was not approved for GLP P1  antagonist.  Will consider seeing if this can be approved at subsequent follow-up visits with the onset of new calendar year.      Essential hypertension - Primary   Near optimal.  Mildly elevated at 136/80 mmHg Continue carvedilol 6.25 mg twice daily. He has not been taking losartan, we crosscheck his medications in the bag.  Since starting amlodipine 2.5 mg once daily today. Target below 130 over 80 mmHg.        Relevant Medications   amLODipine (NORVASC) 2.5 MG tablet   rosuvastatin (CRESTOR) 5 MG tablet   Other Relevant Orders   EKG 12-Lead (Completed)   Pure hypercholesterolemia   Not on any lipid-lowering therapy. Does have risk factors in the form of CAD. Mild intolerance to atorvastatin in the past. Willing to try Crestor. Will prescribe 5 mg Crestor once a day. Follow-up in 3 months for interval check on tolerance and if doing well we will check liver function test.       Relevant Medications   amLODipine (NORVASC) 2.5 MG tablet   rosuvastatin (CRESTOR) 5 MG tablet    Return to clinic in 3 months.  History of Present Illness:    Jerome Phelps is a 62 y.o. male who is being seen today for follow-up visit. Last visit in our office was 03/02/2023 with Jerome Bamberg, NP-C. PCP is Rick Duff, PA-C.   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    Pleasant man here for the visit accompanied by his wife. Continues to drive a dump truck, remain seated for over 10 hours in his truck with limited in and out of  time.  Continues to have discomfort in the abdomen and his major complaint continues to remain being out of breath with minimal activity. Denies any chest pain.  Is frustrated with no insurance coverage for GLP 1 antagonist.  Drinks 2-3 [employer] sodas a day.  Has been with compliant with medications. Continues to be on dual antiplatelet therapy with aspirin and Plavix.  Also continues on carvedilol 6.25 mg twice daily. Furosemide 40 mg once daily. Currently not on statins. Does not remember having tried Crestor or simvastatin before. There is an intolerance listed to atorvastatin with muscle aches.  EKG in the clinic today shows sinus rhythm heart rate 77/min, PR interval 194 ms, QRS duration 84 ms, QTc 454 ms.  Last lab work from 03/02/2023 BUN 14, creatinine 1.24, EGFR 65 Sodium 141, potassium 4.7.  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   4.3 cm   Baker's cyst 04/19/2021   Carotid artery occlusion    CHF (congestive heart failure) (HCC)    Chronic pain of both knees 05/29/2017   CKD (chronic kidney disease) stage 3, GFR 30-59 ml/min (HCC) 2024   Coronary artery disease 11/10/2016   Cardiac catheterization done in 2014 showing 20% LAD   Diastolic congestive heart failure (HCC)    Dyslipidemia 11/10/2016   Dyspnea on exertion 03/25/2021   Essential hypertension 08/22/2021   Hyperlipidemia    Low HDL (under 40) 03/24/2019   Lumbar radiculopathy 11/04/2013   Morbid obesity (HCC) 12/11/2016   Myocardial infarction (HCC)    10/21/22   NSTEMI (non-ST elevated myocardial infarction) (HCC) 10/21/2021   Overweight 10/27/2021   Peripheral neuropathy, idiopathic 11/04/2013   Pure hypercholesterolemia    S/P TKR (total knee replacement) using cement, left 09/11/2019   Sleep apnea    Umbilical hernia without obstruction and without gangrene 07/18/2022   Venous insufficiency     Past Surgical History:  Procedure Laterality  Date   CORONARY ANGIOPLASTY     Right/Left   KNEE SURGERY Left    LEFT HEART CATH AND CORONARY ANGIOGRAPHY N/A 10/21/2021   Procedure: LEFT HEART CATH AND CORONARY ANGIOGRAPHY;  Surgeon: Corky Crafts, MD;  Location: [facility] INVASIVE CV LAB;  Service: Cardiovascular;  Laterality: N/A;    Current Medications: Current Meds  Medication Sig   amLODipine (NORVASC) 2.5 MG tablet Take 1 tablet (2.5 mg total) by mouth daily.   aspirin EC 81 MG tablet Take 1 tablet (81 mg total) by mouth daily. Swallow whole.   carvedilol (COREG) 6.25 MG tablet TAKE ONE TABLET BY MOUTH TWICE DAILY WITH A MEAL   clopidogrel (PLAVIX) 75 MG tablet TAKE ONE TABLET BY MOUTH EVERY DAY   furosemide (LASIX) 40 MG tablet Take 1 tablet (40 mg total) by mouth daily. May take extra dose in the evening for shortness of breath.   furosemide (LASIX) 40 MG tablet Take 40 mg by mouth daily as needed for edema.   meloxicam (MOBIC) 15 MG tablet Take 15 mg by mouth daily.   multivitamin (ONE-A-DAY  MEN'S) TABS tablet Take 1 tablet by mouth daily with breakfast.   potassium chloride SA (KLOR-CON M) 20 MEQ tablet Take 20 mEq by mouth as needed (when taking Furosemide).   rosuvastatin (CRESTOR) 5 MG tablet Take 1 tablet (5 mg total) by mouth daily.   Semaglutide-Weight Management 2.4 MG/0.75ML SOAJ Inject 2.4 mg into the skin once a week for 28 days.   [DISCONTINUED] empagliflozin (JARDIANCE) 10 MG TABS tablet Take 1 tablet (10 mg total) by mouth daily before breakfast.   [DISCONTINUED] losartan (COZAAR) 25 MG tablet TAKE ONE TABLET BY MOUTH EVERY DAY     Allergies:   Atorvastatin   Social History   Socioeconomic History   Marital status: Married    Spouse name: Not on file   Number of children: 1   Years of education: Not on file   Highest education level: Not on file  Occupational History   Not on file  Tobacco Use   Smoking status: Never   Smokeless tobacco: Never  Vaping Use   Vaping status: Never Used  Substance and  Sexual Activity   Alcohol use: No   Drug use: No   Sexual activity: Not on file  Other Topics Concern   Not on file  Social History Narrative   Not on file   Social Drivers of Health   Financial Resource Strain: Not on file  Food Insecurity: Not on file  Transportation Needs: Not on file  Physical Activity: Not on file  Stress: Not on file  Social Connections: Unknown (09/18/2021)   Received from [facility]   Social Network    Social Network: Not on file     Family History: The patient's family history includes Lung cancer in his father; Thyroid cancer in his mother. There is no history of Colon cancer, Colon polyps, Esophageal cancer, Rectal cancer, or Stomach cancer. ROS:   Please see the history of present illness.    All 14 point review of systems negative except as described per history of present illness.  EKGs/Labs/Other Studies Reviewed:    The following studies were reviewed today: Cardiac Studies & Procedures   CARDIAC CATHETERIZATION  CARDIAC CATHETERIZATION 10/21/2021  Narrative   LPAV lesion is 50% stenosed.   1st Mrg lesion is 70% stenosed.   LV end diastolic pressure is severely elevated.  LVEDP 30 mmHg.   The left ventricular ejection fraction is 55-65% by visual estimate.   There is no aortic valve stenosis.   Moderate, diffuse coronary disease noted throughout the left system, most prominently in the LAD.  Mildly elevated troponin done at [facility].  No obvious culprit lesion.  His LVEDP is very high.  He has some diffuse, branch vessel lesions.  This may have been demand ischemia, which is consistent with his normal EF noted by echo and by cath.  Diurese aggressively and try to optimize medications.  He will need aggressive secondary prevention including statin therapy.  Clopidogrel also started in the Cath Lab due to elevated troponin.  Findings Coronary Findings Diagnostic  Dominance: Left  Left Anterior Descending There  is moderate diffuse disease throughout the vessel.  Left Circumflex There is mild diffuse disease throughout the vessel.  First Obtuse Marginal Branch 1st Mrg lesion is 70% stenosed. The lesion is irregular.  Left Posterior Atrioventricular Artery LPAV lesion is 50% stenosed.  Intervention  No interventions have been documented.   STRESS TESTS  MYOCARDIAL PERFUSION IMAGING 06/25/2019  Narrative  Nuclear stress EF: 63%.  The left ventricular ejection fraction is normal (55-65%).  There was no ST segment deviation noted during stress.  No T wave inversion was noted during stress.  The study is normal.  This is a low risk study.  Low risk stress nuclear study with normal perfusion and normal left ventricular regional and global systolic function.  ECHOCARDIOGRAM  ECHOCARDIOGRAM COMPLETE 04/15/2021  Narrative ECHOCARDIOGRAM REPORT    Patient Name:   Moussa Willmore Date of Exam: 04/15/2021 Medical Rec #:  [id number]    Height:       73.0 in Accession #:    [id number]   Weight:       369.8 lb Date of Birth:  06-23-1960     BSA:          2.794 m Patient Age:    60 years     BP:           136/84 mmHg Patient Gender: M            HR:           81 bpm. Exam Location:  [place]  Procedure: 2D Echo, Cardiac Doppler, Color Doppler and Strain Analysis  Indications:    Dyspnea R06.00  History:        Patient has no prior history of Echocardiogram examinations. CAD; Signs/Symptoms:Swelling of both lower extremities.  Sonographer:    Louie Boston RDCS Referring Phys: [id number] Georgeanna Lea   Sonographer Comments: Technically challenging study due to limited acoustic windows. IMPRESSIONS   1. Left ventricular ejection fraction, by estimation, is 50 to 55%. The left ventricle has low normal function. Left ventricular endocardial border not optimally defined to evaluate regional wall motion. There is moderate concentric left ventricular hypertrophy. Left ventricular diastolic  parameters are consistent with Grade I diastolic dysfunction (impaired relaxation). 2. Right ventricular systolic function is normal. The right ventricular size is normal. Tricuspid regurgitation signal is inadequate for assessing PA pressure. 3. The mitral valve is normal in structure. No evidence of mitral valve regurgitation. No evidence of mitral stenosis. 4. The aortic valve is tricuspid. Aortic valve regurgitation is not visualized. No aortic stenosis is present. 5. There is moderate dilatation of the ascending aorta, measuring 40 mm.  FINDINGS Left Ventricle: Left ventricular ejection fraction, by estimation, is 50 to 55%. The left ventricle has low normal function. Left ventricular endocardial border not optimally defined to evaluate regional wall motion. Global longitudinal strain performed but not reported based on interpreter judgement due to suboptimal tracking. The left ventricular internal cavity size was normal in size. There is moderate concentric left ventricular hypertrophy. Left ventricular diastolic parameters are consistent with Grade I diastolic dysfunction (impaired relaxation). Indeterminate filling pressures.  Right Ventricle: The right ventricular size is normal. No increase in right ventricular wall thickness. Right ventricular systolic function is normal. Tricuspid regurgitation signal is inadequate for assessing PA pressure. The tricuspid regurgitant velocity is 1.66 m/s, and with an assumed right atrial pressure of 3 mmHg, the estimated right ventricular systolic pressure is 14.0 mmHg.  Left Atrium: Left atrial size was normal in size.  Right Atrium: Right atrial size was normal in size.  Pericardium: There is no evidence of pericardial effusion.  Mitral Valve: The mitral valve is normal in structure. No evidence of mitral valve regurgitation. No evidence of mitral valve stenosis.  Tricuspid Valve: The tricuspid valve is normal in structure. Tricuspid valve  regurgitation is trivial. No evidence of tricuspid stenosis.  Aortic Valve: The aortic valve is tricuspid. Aortic  valve regurgitation is not visualized. No aortic stenosis is present.  Pulmonic Valve: The pulmonic valve was normal in structure. Pulmonic valve regurgitation is trivial. No evidence of pulmonic stenosis.  Aorta: The aortic root is normal in size and structure and the aortic arch was not well visualized. There is moderate dilatation of the ascending aorta, measuring 40 mm.  Venous: A normal flow pattern is recorded from the right upper pulmonary vein. The inferior vena cava was not well visualized.  IAS/Shunts: No atrial level shunt detected by color flow Doppler.   LEFT VENTRICLE PLAX 2D LVIDd:         4.00 cm   Diastology LVIDs:         3.10 cm   LV e' medial:    5.87 cm/s LV PW:         1.40 cm   LV E/e' medial:  16.1 LV IVS:        1.40 cm   LV e' lateral:   7.51 cm/s LVOT diam:     2.50 cm   LV E/e' lateral: 12.6 LV SV:         121 LV SV Index:   43 LVOT Area:     4.91 cm   RIGHT VENTRICLE RV S prime:     15.20 cm/s TAPSE (M-mode): 4.0 cm  LEFT ATRIUM             Index        RIGHT ATRIUM           Index LA diam:        3.90 cm 1.40 cm/m   RA Area:     18.40 cm LA Vol (A2C):   78.1 ml 27.95 ml/m  RA Volume:   52.90 ml  18.93 ml/m LA Vol (A4C):   82.8 ml 29.64 ml/m LA Biplane Vol: 81.6 ml 29.21 ml/m AORTIC VALVE LVOT Vmax:   105.00 cm/s LVOT Vmean:  79.000 cm/s LVOT VTI:    0.246 m  AORTA Ao Root diam: 3.60 cm Ao Asc diam:  4.05 cm  MITRAL VALVE               TRICUSPID VALVE MV Area (PHT): 3.48 cm    TR Peak grad:   11.0 mmHg MV Decel Time: 218 msec    TR Vmax:        166.00 cm/s MV E velocity: 94.70 cm/s MV A velocity: 78.00 cm/s  SHUNTS MV E/A ratio:  1.21        Systemic VTI:  0.25 m Systemic Diam: 2.50 cm  Norman Herrlich MD Electronically signed by Norman Herrlich MD Signature Date/Time: 04/17/2021/9:50:49 AM    Final              EKG:  EKG Interpretation Date/Time:  Friday April 20 2023 16:04:51 EST Ventricular Rate:  77 PR Interval:  194 QRS Duration:  84 QT Interval:  402 QTC Calculation: 454 R Axis:   -3  Text Interpretation: Normal sinus rhythm Minimal voltage criteria for LVH, may be normal variant Borderline ECG No previous ECGs available Confirmed by Huntley Dec reddy [phone]) on 04/20/2023 3:41:32 PM    Recent Labs: 08/31/2022: ALT 16 03/02/2023: BUN 14; Creatinine, Ser 1.24; Potassium 4.7; Sodium 141  Recent Lipid Panel    Component Value Date/Time   CHOL 128 08/31/2022 0850   TRIG 117 08/31/2022 0850   HDL 38 (L) 08/31/2022 0850   CHOLHDL 3.4 08/31/2022 0850   CHOLHDL 3.8 10/23/2021 0930  VLDL 31 10/23/2021 0930   LDLCALC 69 08/31/2022 0850    Physical Exam:    VS:  BP 136/80 (BP Location: Right Arm, Patient Position: Sitting)   Pulse 77   Ht 6\' 1"  (1.854 m)   Wt (!) 365 lb 12.8 oz (165.9 kg)   SpO2 91%   BMI 48.26 kg/m     Wt Readings from Last 3 Encounters:  04/20/23 (!) 365 lb 12.8 oz (165.9 kg)  03/02/23 (!) 363 lb (164.7 kg)  11/30/22 (!) 363 lb 12.8 oz (165 kg)     GENERAL:  Well nourished, well developed in no acute distress NECK: No JVD; No carotid bruits CARDIAC: RRR, S1 and S2 present, no murmurs, no rubs, no gallops CHEST:  Clear to auscultation without rales, wheezing or rhonchi  ABDOMEN: Soft, non-tender, obese extremities: No pitting pedal edema. Pulses bilaterally symmetric with radial 2+ and dorsalis pedis 2+ NEUROLOGIC:  Alert and oriented x 3  Medication Adjustments/Labs and Tests Ordered: Current medicines are reviewed at length with the patient today.  Concerns regarding medicines are outlined above.  Orders Placed This Encounter  Procedures   EKG 12-Lead   Meds ordered this encounter  Medications   amLODipine (NORVASC) 2.5 MG tablet    Sig: Take 1 tablet (2.5 mg total) by mouth daily.    Dispense:  180 tablet    Refill:  3   rosuvastatin  (CRESTOR) 5 MG tablet    Sig: Take 1 tablet (5 mg total) by mouth daily.    Dispense:  90 tablet    Refill:  3    Signed, Mikia Delaluz reddy Zhania Shaheen, MD, MPH, [facility]. 04/20/2023 4:11 PM    [facility]

## 2023-04-20 NOTE — Assessment & Plan Note (Signed)
Last cardiac catheterization June 2023 at [facility] with moderate diffuse disease, 70% lesion of obtuse marginal and 50% lesion of PLB, in the setting of NSTEMI, felt to have no obvious culprit lesion and recommended to continue with medical therapy.  LVEDP was also noted to be elevated.  Continue aspirin 81 mg once daily Continue Plavix 75 mg once daily as he remains to be tolerating dual antiplatelet therapy well without any side effects. Given his ongoing atypical symptoms we will continue dual antiplatelet therapy so long as he tolerates it without side effects.

## 2023-04-20 NOTE — Assessment & Plan Note (Signed)
Near optimal.  Mildly elevated at 136/80 mmHg Continue carvedilol 6.25 mg twice daily. He has not been taking losartan, we crosscheck his medications in the bag.  Since starting amlodipine 2.5 mg once daily today. Target below 130 over 80 mmHg.

## 2023-05-15 ENCOUNTER — Other Ambulatory Visit: Payer: Self-pay | Admitting: Cardiology

## 2023-05-23 ENCOUNTER — Other Ambulatory Visit: Payer: Self-pay

## 2023-05-23 MED ORDER — CARVEDILOL 6.25 MG PO TABS: 6.2500 mg | ORAL_TABLET | Freq: Two times a day (BID) | ORAL | 2 refills | Status: DC

## 2023-07-20 ENCOUNTER — Other Ambulatory Visit: Payer: Self-pay | Admitting: Cardiology

## 2023-07-26 ENCOUNTER — Encounter: Payer: Self-pay | Admitting: Cardiology

## 2023-07-26 ENCOUNTER — Other Ambulatory Visit (HOSPITAL_COMMUNITY): Payer: Self-pay

## 2023-07-26 ENCOUNTER — Ambulatory Visit: Payer: Managed Care, Other (non HMO) | Attending: Cardiology | Admitting: Cardiology

## 2023-07-26 ENCOUNTER — Telehealth: Payer: Self-pay | Admitting: Cardiology

## 2023-07-26 ENCOUNTER — Telehealth: Payer: Self-pay | Admitting: Pharmacy Technician

## 2023-07-26 VITALS — BP 128/84 | HR 78 | Ht 73.0 in | Wt 361.6 lb

## 2023-07-26 DIAGNOSIS — I10 Essential (primary) hypertension: Secondary | ICD-10-CM | POA: Diagnosis not present

## 2023-07-26 DIAGNOSIS — I25118 Atherosclerotic heart disease of native coronary artery with other forms of angina pectoris: Secondary | ICD-10-CM

## 2023-07-26 DIAGNOSIS — E663 Overweight: Secondary | ICD-10-CM

## 2023-07-26 DIAGNOSIS — I5032 Chronic diastolic (congestive) heart failure: Secondary | ICD-10-CM | POA: Diagnosis not present

## 2023-07-26 DIAGNOSIS — R5383 Other fatigue: Secondary | ICD-10-CM

## 2023-07-26 DIAGNOSIS — R0609 Other forms of dyspnea: Secondary | ICD-10-CM

## 2023-07-26 DIAGNOSIS — E782 Mixed hyperlipidemia: Secondary | ICD-10-CM | POA: Diagnosis not present

## 2023-07-26 MED ORDER — SEMAGLUTIDE-WEIGHT MANAGEMENT 1 MG/0.5ML ~~LOC~~ SOAJ: 1.0000 mg | SUBCUTANEOUS | 0 refills | Status: AC

## 2023-07-26 MED ORDER — SEMAGLUTIDE-WEIGHT MANAGEMENT 1.7 MG/0.75ML ~~LOC~~ SOAJ
1.7000 mg | SUBCUTANEOUS | 0 refills | Status: DC
Start: 2023-10-21 — End: 2023-10-25

## 2023-07-26 MED ORDER — SEMAGLUTIDE-WEIGHT MANAGEMENT 0.25 MG/0.5ML ~~LOC~~ SOAJ: 0.2500 mg | SUBCUTANEOUS | 0 refills | Status: AC

## 2023-07-26 MED ORDER — SEMAGLUTIDE-WEIGHT MANAGEMENT 2.4 MG/0.75ML ~~LOC~~ SOAJ: 2.4000 mg | SUBCUTANEOUS | 0 refills | Status: DC

## 2023-07-26 MED ORDER — SEMAGLUTIDE-WEIGHT MANAGEMENT 0.5 MG/0.5ML ~~LOC~~ SOAJ: 0.5000 mg | SUBCUTANEOUS | 0 refills | Status: AC

## 2023-07-26 NOTE — Telephone Encounter (Signed)
 Got coupon   Manson [id number] PCN CNRX ID [id number] GROUP [id number]  I called [facility] and they said they have to order the wegovy 0.25mg  and when they comes in then they will run on the ins and the coupon. They couldn't tell me the coupon until the drug comes in. I will check on it tomorrow

## 2023-07-26 NOTE — Addendum Note (Signed)
 Addended by: Baldo Ash D on: 07/26/2023 09:24 AM   Modules accepted: Orders

## 2023-07-26 NOTE — Telephone Encounter (Signed)
 Pt c/o medication issue:  1. Name of Medication:   Semaglutide-Weight Management 0.25 MG/0.5ML SOAJ    2. How are you currently taking this medication (dosage and times per day)? Inject 0.25 mg into the skin once a week for 28 days.   3. Are you having a reaction (difficulty breathing--STAT)? No  4. What is your medication issue? Patient's wife called stating the insurance will only cover $100 and the out of pocket cost is over $1,000. Patient's wife is requesting to know if we have a discount card or if there would be another way to get the medication at a more affordable price. Patient's wife requested we call the patient in regard to this. Please advise.

## 2023-07-26 NOTE — Telephone Encounter (Signed)
 Pt c/o medication issue:  1. Name of Medication:  Semaglutide-Weight Management 0.25 MG/0.5ML SOAJ   2. How are you currently taking this medication (dosage and times per day)?    3. Are you having a reaction (difficulty breathing--STAT)? no  4. What is your medication issue? Calling with questions about the discount card for this medication. Please advise  .

## 2023-07-26 NOTE — Progress Notes (Signed)
 Cardiology Office Note:    Date:  07/26/2023   ID:  Jerome Phelps, DOB [birth year], MRN [id number]  PCP:  Rick Duff, PA-C  Cardiologist:  Gypsy Balsam, MD    Referring MD: Rick Duff, PA-C   Chief Complaint  Patient presents with   Shortness of Breath   Fatigue   Medication Management    History of Present Illness:    Jerome Phelps is a 63 y.o. male past medical history significant for coronary artery disease he did have cardiac catheterization done in 2023 which showed 50% posterolateral branch 70% obtuse marginal branch at that time he was found to have significantly elevated LVEDP so diagnosis of diastolic congestive heart failure has been made since he does have preserved left ventricle ejection fraction, additional problem include obstructive sleep apnea, morbid obesity, essential hypertension.  He is complaining of having fatigue tiredness shortness of breath.  He was referred to pulmonary however no pulmonary reason for his dyspnea on exertion has been identified.  Comes today to my office still complaining of having shortness of breath, denies have any chest pain tightness squeezing pressure burning chest.  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.3 cm   Baker's cyst 04/19/2021   Carotid artery occlusion    CHF (congestive heart failure) (HCC)    Chronic pain of both knees 05/29/2017   CKD (chronic kidney disease) stage 3, GFR 30-59 ml/min (HCC) 2024   Coronary artery disease 11/10/2016   Cardiac catheterization done in 2014 showing 20% LAD   Diastolic congestive heart failure (HCC)    Dyslipidemia 11/10/2016   Dyspnea on exertion 03/25/2021   Essential hypertension 08/22/2021   Hyperlipidemia    Low HDL (under 40) 03/24/2019   Lumbar radiculopathy 11/04/2013   Morbid obesity (HCC) 12/11/2016   Myocardial infarction (HCC)    10/21/22   NSTEMI (non-ST elevated  myocardial infarction) (HCC) 10/21/2021   Overweight 10/27/2021   Peripheral neuropathy, idiopathic 11/04/2013   Pure hypercholesterolemia    S/P TKR (total knee replacement) using cement, 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 Swallow whole.   carvedilol (COREG) 6.25 MG tablet Take 1 tablet (6.25 mg total) by mouth 2 (two) times daily with a meal.   clopidogrel (PLAVIX) 75 MG tablet TAKE ONE TABLET BY MOUTH EVERY DAY   furosemide (LASIX) 40 MG tablet Take 1 tablet (40 mg total) by mouth daily. May take extra dose in the evening for shortness of breath.   furosemide (LASIX) 40 MG tablet Take 40 mg by mouth daily as needed for edema.   meloxicam (MOBIC) 15 MG tablet Take 15 mg by mouth daily.   multivitamin (ONE-A-DAY MEN'S) TABS tablet Take 1 tablet by mouth daily with breakfast.   Potassium Chloride ER 20 MEQ TBCR Take 1 tablet by mouth daily.  potassium chloride SA (KLOR-CON M) 20 MEQ tablet Take 20 mEq by mouth as needed (when taking Furosemide).   [DISCONTINUED] amLODipine (NORVASC) 2.5 MG tablet Take 1 tablet (2.5 mg total) by mouth daily.   [DISCONTINUED] rosuvastatin (CRESTOR) 5 MG tablet Take 1 tablet (5 mg total) by mouth daily.     Allergies:   Atorvastatin   Social History   Socioeconomic History   Marital status: Married    Spouse name: Not on file   Number of children: 1   Years of education: Not on file   Highest education  level: Not on file  Occupational History   Not on file  Tobacco Use   Smoking status: Never   Smokeless tobacco: Never  Vaping Use   Vaping status: Never Used  Substance and Sexual Activity   Alcohol use: No   Drug use: No   Sexual activity: Not on file  Other Topics Concern   Not on file  Social History Narrative   Not on file   Social Drivers of Health   Financial Resource Strain: Not on file  Food Insecurity: Not on file  Transportation Needs: Not on file  Physical Activity: Not on file  Stress: Not on file  Social Connections: Unknown (09/18/2021)   Received from [facility], [facility]   Social Network    Social Network: Not on file     Family History: The patient's family history includes Lung cancer in his father; Thyroid cancer in his mother. There is no history of Colon cancer, Colon polyps, Esophageal cancer, Rectal cancer, or Stomach cancer. ROS:   Please see the history of present illness.    All 14 point review of systems negative except as described per history of present illness  EKGs/Labs/Other Studies Reviewed:         Recent Labs: 08/31/2022: ALT 16 03/02/2023: BUN 14; Creatinine, Ser 1.24; Potassium 4.7; Sodium 141  Recent Lipid Panel    Component Value Date/Time   CHOL 128 08/31/2022 0850   TRIG 117 08/31/2022 0850   HDL 38 (L) 08/31/2022 0850   CHOLHDL 3.4 08/31/2022 0850   CHOLHDL 3.8 10/23/2021 0930   VLDL 31 10/23/2021 0930   LDLCALC 69 08/31/2022 0850    Physical Exam:    VS:  BP 128/84 (BP Location: Right Arm, Patient Position: Sitting)   Pulse 78   Ht 6\' 1"  (1.854 m)   Wt (!) 361 lb 9.6 oz (164 kg)   SpO2 93%   BMI 47.71 kg/m     Wt Readings from Last 3 Encounters:  07/26/23 (!) 361 lb 9.6 oz (164 kg)  04/20/23 (!) 365 lb 12.8 oz (165.9 kg)  03/02/23 (!) 363 lb (164.7 kg)     GEN:  Well nourished, well developed in no acute distress HEENT: Normal NECK: No JVD; No carotid bruits LYMPHATICS: No  lymphadenopathy CARDIAC: RRR, no murmurs, no rubs, no gallops RESPIRATORY:  Clear to auscultation without rales, wheezing or rhonchi  ABDOMEN: Soft, non-tender, non-distended MUSCULOSKELETAL:  No edema; No deformity  SKIN: Warm and dry LOWER EXTREMITIES: no swelling NEUROLOGIC:  Alert and oriented x 3 PSYCHIATRIC:  Normal affect   ASSESSMENT:    1. Coronary artery disease of native artery of native heart with stable angina pectoris (HCC)   2. Chronic diastolic congestive heart failure (HCC)   3. Essential hypertension   4. Mixed hyperlipidemia   5. Overweight    PLAN:    In order of problems listed above:  Dyspnea exertion  I am convinced is multifactorial including obesity and some inactivity play significant role here however he said he is getting slightly worse and he gave examples of things that he was able to do before about a year ago now he cannot do it.  I will schedule him to have echocardiogram to assess left ventricle ejection fraction look also diastolic function and pulmonary pressure.  I will check proBNP as well as Chem-7 today to see if there is any room to augment his therapy. Obstructive sleep apnea he does use CPAP mask however he said he does not think it works well I recommended for him to contact his primary care physician and have equipment checked make sure it is functioning properly. Essential hypertension: Blood pressure well-controlled continue present management. He complains that his shortness of breath is related to dual antiplatelet therapy.  Ask him to stop Plavix temporarily and see if that make any better however honestly I doubt that this is the reason. Dyslipidemia I did review his K PN which show me LDL 69 HDL 38.  Will continue present management including Crestor. Overweight obviously huge problem.  I am surprised and shocked by the fact that insurance company did not want to approve [facility] for him clearly it will be cheaper and better for patient to  prevent him from having complication of obesity then dealing with the problems that I will anticipate him to have in the future we will try to refill application 1 more time for [facility] and hopefully that will work this time.  He does have morbid obesity with already obstructive sleep apnea coronary artery disease and essential hypertension.  He tried to do with diet with exercises unsuccessfully therefore Reginal Lutes will be clearly indicated.   Medication Adjustments/Labs and Tests Ordered: Current medicines are reviewed at length with the patient today.  Concerns regarding medicines are outlined above.  No orders of the defined types were placed in this encounter.  Medication changes: No orders of the defined types were placed in this encounter.   Signed, Georgeanna Lea, MD, [facility] 07/26/2023 9:03 AM    [facility]

## 2023-07-26 NOTE — Patient Instructions (Addendum)
 Medication Instructions:  Your physician recommends that you continue on your current medications as directed. Please refer to the Current Medication list given to you today.  *If you need a refill on your cardiac medications before your next appointment, please call your pharmacy*   Lab Work: ProBNP, BMP, TSH-today If you have labs (blood work) drawn today and your tests are completely normal, you will receive your results only by: MyChart Message (if you have MyChart) OR A paper copy in the mail If you have any lab test that is abnormal or we need to change your treatment, we will call you to review the results.   Testing/Procedures: Your physician has requested that you have an echocardiogram. Echocardiography is a painless test that uses sound waves to create images of your heart. It provides your doctor with information about the size and shape of your heart and how well your heart's chambers and valves are working. This procedure takes approximately one hour. There are no restrictions for this procedure. Please do NOT wear cologne, perfume, aftershave, or lotions (deodorant is allowed). Please arrive 15 minutes prior to your appointment time.  Please note: We ask at that you not bring children with you during ultrasound (echo/ vascular) testing. Due to room size and safety concerns, children are not allowed in the ultrasound rooms during exams. Our front office staff cannot provide observation of children in our lobby area while testing is being conducted. An adult accompanying a patient to their appointment will only be allowed in the ultrasound room at the discretion of the ultrasound technician under special circumstances. We apologize for any inconvenience.    Follow-Up: At [facility], you and your health needs are our priority.  As part of our continuing mission to provide you with exceptional heart care, we have created designated Provider Care Teams.  These Care Teams include  your primary Cardiologist (physician) and Advanced Practice Providers (APPs -  Physician Assistants and Nurse Practitioners) who all work together to provide you with the care you need, when you need it.  We recommend signing up for the patient portal called "MyChart".  Sign up information is provided on this After Visit Summary.  MyChart is used to connect with patients for Virtual Visits (Telemedicine).  Patients are able to view lab/test results, encounter notes, upcoming appointments, etc.  Non-urgent messages can be sent to your provider as well.   To learn more about what you can do with MyChart, go to [email or web address].    Your next appointment:   3 month(s)  The format for your next appointment:   In Person  Provider:   Gypsy Balsam, MD    Other Instructions NA

## 2023-07-27 ENCOUNTER — Telehealth: Payer: Self-pay

## 2023-07-27 ENCOUNTER — Other Ambulatory Visit (HOSPITAL_COMMUNITY): Payer: Self-pay

## 2023-07-27 DIAGNOSIS — I10 Essential (primary) hypertension: Secondary | ICD-10-CM

## 2023-07-27 LAB — BASIC METABOLIC PANEL
BUN/Creatinine Ratio: 15 (ref 10–24)
BUN: 15 mg/dL (ref 8–27)
CO2: 23 mmol/L (ref 20–29)
Calcium: 9 mg/dL (ref 8.6–10.2)
Chloride: 103 mmol/L (ref 96–106)
Creatinine, Ser: 1.01 mg/dL (ref 0.76–1.27)
Glucose: 83 mg/dL (ref 70–99)
Potassium: 5.1 mmol/L (ref 3.5–5.2)
Sodium: 141 mmol/L (ref 134–144)
eGFR: 83 mL/min/{1.73_m2} (ref >59)

## 2023-07-27 LAB — TSH: TSH: 2.28 u[IU]/mL (ref 0.450–4.500)

## 2023-07-27 LAB — PRO B NATRIURETIC PEPTIDE: NT-Pro BNP: 74 pg/mL (ref 0–376)

## 2023-07-27 NOTE — Telephone Encounter (Signed)
Left message on My Chart with Lab results per Dr. Vanetta Shawl note. Routed to PCP.

## 2023-07-27 NOTE — Telephone Encounter (Signed)
I called the patient back and answered his questions.   

## 2023-07-27 NOTE — Telephone Encounter (Signed)
 I spoke to [facility] and they said it was actually 1500 on just insurance and with the coupon its still 1100.00. patient said he can not afford that. I tried to see what the copay would be for ozempic and it came back that it would need a prior authorization. Sending to team to see what they would like to do since he can not afford it and there is no other assistance for wegovy.

## 2023-07-30 ENCOUNTER — Telehealth: Payer: Self-pay

## 2023-07-30 NOTE — Telephone Encounter (Signed)
Pt viewed results on My Chart per Dr. Krasowski's note. Routed to PCP.  

## 2023-08-14 ENCOUNTER — Ambulatory Visit

## 2023-09-05 ENCOUNTER — Ambulatory Visit: Attending: Cardiology

## 2023-09-05 DIAGNOSIS — R0609 Other forms of dyspnea: Secondary | ICD-10-CM

## 2023-09-05 MED ORDER — PERFLUTREN LIPID MICROSPHERE
1.0000 mL | INTRAVENOUS | Status: AC | PRN
  Administered 2023-09-05: 10 mL via INTRAVENOUS

## 2023-09-06 LAB — ECHOCARDIOGRAM COMPLETE
Area-P 1/2: 3.53 cm2
S' Lateral: 3.5 cm

## 2023-09-07 ENCOUNTER — Telehealth: Payer: Self-pay

## 2023-09-07 NOTE — Telephone Encounter (Signed)
 Left message on My Chart with normal results per Dr. Vanetta Shawl note. Routed to PCP.

## 2023-09-10 ENCOUNTER — Telehealth: Payer: Self-pay

## 2023-09-10 NOTE — Telephone Encounter (Signed)
 Echo Results reviewed with pt as per Dr. Vanetta Shawl note.  Pt verbalized understanding and had no additional questions. Routed to PCP

## 2023-09-21 ENCOUNTER — Other Ambulatory Visit: Payer: Self-pay | Admitting: Cardiology

## 2023-09-24 ENCOUNTER — Other Ambulatory Visit: Payer: Self-pay

## 2023-09-24 ENCOUNTER — Telehealth: Payer: Self-pay

## 2023-09-24 MED ORDER — NITROGLYCERIN 0.4 MG SL SUBL: 0.4000 mg | SUBLINGUAL_TABLET | SUBLINGUAL | 1 refills | Status: AC | PRN

## 2023-09-24 NOTE — Telephone Encounter (Signed)
 Patient called the office and reported that when he becomes SOB he has started having sharp chest pains in the center of his chest. The chest pains occur anywhere from a few minutes to all night long. He states that these chest pains have started occurring about 2 weeks ago. Currently he states that he is pain free. Spoke with Dr. Krasowski regarding the patient having chest pain and he recommended to send in a prescription for Nitroglycerin  for the patient to take when he has these sharp chest pains. A prescription for Nitroglycerin  was ordered via Epic and sent to the patient's pharmacy. The patient was instructed how to take the Nitroglycerin . Patient verbalized understanding and had no further questions at this time.

## 2023-09-25 NOTE — Telephone Encounter (Signed)
 Rx refill sent to pharmacy.

## 2023-10-25 ENCOUNTER — Ambulatory Visit: Attending: Cardiology | Admitting: Cardiology

## 2023-10-25 ENCOUNTER — Encounter: Payer: Self-pay | Admitting: Cardiology

## 2023-10-25 VITALS — BP 148/78 | HR 87 | Ht 72.0 in | Wt 364.0 lb

## 2023-10-25 DIAGNOSIS — I10 Essential (primary) hypertension: Secondary | ICD-10-CM | POA: Diagnosis not present

## 2023-10-25 DIAGNOSIS — J418 Mixed simple and mucopurulent chronic bronchitis: Secondary | ICD-10-CM

## 2023-10-25 DIAGNOSIS — E782 Mixed hyperlipidemia: Secondary | ICD-10-CM

## 2023-10-25 DIAGNOSIS — I25118 Atherosclerotic heart disease of native coronary artery with other forms of angina pectoris: Secondary | ICD-10-CM | POA: Diagnosis not present

## 2023-10-25 HISTORY — DX: Mixed simple and mucopurulent chronic bronchitis: J41.8

## 2023-10-25 MED ORDER — ISOSORBIDE MONONITRATE ER 30 MG PO TB24: 30.0000 mg | ORAL_TABLET | Freq: Every day | ORAL | 3 refills | Status: DC

## 2023-10-25 NOTE — Progress Notes (Signed)
 Cardiology Office Note:    Date:  10/25/2023   ID:  [name], DOB 03-16-1961, MRN [id number]  PCP:  Ventura Gins, PA-C  Cardiologist:  Ralene Burger, MD    Referring MD: Ventura Gins, PA-C   Chief Complaint  Patient presents with   Shortness of Breath    History of Present Illness:    Jerome Phelps is a 64 y.o. male past medical history significant for coronary artery disease he did have cardiac catheterization done in 2023 which showed 50% posterior lateral branch, 70% obtuse marginal branch at that time he was fine to have just 3 congestive heart failure.  Echocardiogram showed however preserved left ventricle ejection fraction.  Additional problem include morbid obesity obstructive sleep apnea essential hypertension.  We have been battling with his complaint of being fatigued tired short of breath.  He was referred to pulmonary however no pulmonary reason for his shortness of breath has been identified.  Today he said when he tried to walk he will develop tightness in the chest he tried nitroglycerin  with good relief.  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   4.3 cm   Baker's cyst 04/19/2021   Carotid artery occlusion    CHF (congestive heart failure) (HCC)    Chronic pain of both knees 05/29/2017   CKD (chronic kidney disease) stage 3, GFR 30-59 ml/min (HCC) 2024   Coronary artery disease 11/10/2016   Cardiac catheterization done in 2014 showing 20% LAD   Diastolic congestive heart failure (HCC)    Dyslipidemia 11/10/2016   Dyspnea on exertion 03/25/2021   Essential hypertension 08/22/2021   Hyperlipidemia    Low HDL (under 40) 03/24/2019   Lumbar radiculopathy 11/04/2013   Morbid obesity (HCC) 12/11/2016   Myocardial infarction (HCC)    10/21/22   NSTEMI (non-ST elevated myocardial infarction) (HCC) 10/21/2021   Overweight 10/27/2021   Peripheral neuropathy, idiopathic 11/04/2013   Pure hypercholesterolemia     S/P TKR (total knee replacement) using cement, left 09/11/2019   Sleep apnea    Umbilical hernia without obstruction and without gangrene 07/18/2022   Venous insufficiency     Past Surgical History:  Procedure Laterality Date   CORONARY ANGIOPLASTY     Right/Left   KNEE SURGERY Left    LEFT HEART CATH AND CORONARY ANGIOGRAPHY N/A 10/21/2021   Procedure: LEFT HEART CATH AND CORONARY ANGIOGRAPHY;  Surgeon: Lucendia Rusk, MD;  Location: [facility];  Service: Cardiovascular;  Laterality: N/A;    Current Medications: Current Meds  Medication Sig   aspirin  EC 81 MG tablet Take 1 tablet (81 mg total) by mouth daily. Swallow whole.   carvedilol  (COREG ) 6.25 MG tablet Take 1 tablet (6.25 mg total) by mouth 2 (two) times daily with a meal.   celecoxib (CELEBREX) 200 MG capsule Take 200 mg by mouth 2 (two) times daily.   clopidogrel  (PLAVIX ) 75 MG tablet TAKE ONE TABLET BY MOUTH EVERY DAY   furosemide  (LASIX ) 40 MG tablet Take 1 tablet (40 mg total) by mouth daily. May take extra dose in the evening for shortness of breath.   isosorbide mononitrate (IMDUR) 30 MG 24 hr tablet Take 1 tablet (30 mg total) by mouth daily.   multivitamin (ONE-A-DAY MEN'S) TABS tablet Take 1 tablet by mouth daily with breakfast.   nitroGLYCERIN  (NITROSTAT ) 0.4 MG SL tablet Place 1 tablet (0.4 mg total) under the tongue every 5 (five) minutes as needed for chest pain.  Potassium Chloride  ER 20 MEQ TBCR TAKE 1 TABLET BY MOUTH DAILY   [DISCONTINUED] furosemide  (LASIX ) 40 MG tablet Take 40 mg by mouth daily as needed for edema.   [DISCONTINUED] meloxicam  (MOBIC ) 15 MG tablet Take 15 mg by mouth daily.   [DISCONTINUED] potassium chloride  SA (KLOR-CON  M) 20 MEQ tablet Take 20 mEq by mouth as needed (when taking Furosemide ).   [DISCONTINUED] Semaglutide -Weight Management 1.7 MG/0.75ML SOAJ Inject 1.7 mg into the skin once a week for 28 days.   [DISCONTINUED] Semaglutide -Weight Management 2.4 MG/0.75ML SOAJ  Inject 2.4 mg into the skin once a week for 28 days.     Allergies:   Atorvastatin    Social History   Socioeconomic History   Marital status: Married    Spouse name: Not on file   Number of children: 1   Years of education: Not on file   Highest education level: Not on file  Occupational History   Not on file  Tobacco Use   Smoking status: Never   Smokeless tobacco: Never  Vaping Use   Vaping status: Never Used  Substance and Sexual Activity   Alcohol use: No   Drug use: No   Sexual activity: Not on file  Other Topics Concern   Not on file  Social History Narrative   Not on file   Social Drivers of Health   Financial Resource Strain: Not on file  Food Insecurity: Not on file  Transportation Needs: Not on file  Physical Activity: Not on file  Stress: Not on file  Social Connections: Unknown (09/18/2021)   Received from [facility]   Social Network    Social Network: Not on file     Family History: The patient's family history includes Lung cancer in his father; Thyroid  cancer in his mother. There is no history of Colon cancer, Colon polyps, Esophageal cancer, Rectal cancer, or Stomach cancer. ROS:   Please see the history of present illness.    All 14 point review of systems negative except as described per history of present illness  EKGs/Labs/Other Studies Reviewed:         Recent Labs: 07/26/2023: BUN 15; Creatinine, Ser 1.01; NT-Pro BNP 74; Potassium 5.1; Sodium 141; TSH 2.280  Recent Lipid Panel    Component Value Date/Time   CHOL 128 08/31/2022 0850   TRIG 117 08/31/2022 0850   HDL 38 (L) 08/31/2022 0850   CHOLHDL 3.4 08/31/2022 0850   CHOLHDL 3.8 10/23/2021 0930   VLDL 31 10/23/2021 0930   LDLCALC 69 08/31/2022 0850    Physical Exam:    VS:  BP (!) 148/78 (BP Location: Left Arm, Patient Position: Sitting)   Pulse 87   Ht 6' (1.829 m)   Wt (!) 364 lb (165.1 kg)   SpO2 97%   BMI 49.37 kg/m     Wt Readings from Last 3 Encounters:   10/25/23 (!) 364 lb (165.1 kg)  07/26/23 (!) 361 lb 9.6 oz (164 kg)  04/20/23 (!) 365 lb 12.8 oz (165.9 kg)     GEN:  Well nourished, well developed in no acute distress HEENT: Normal NECK: No JVD; No carotid bruits LYMPHATICS: No lymphadenopathy CARDIAC: RRR, no murmurs, no rubs, no gallops RESPIRATORY:  Clear to auscultation without rales, wheezing or rhonchi  ABDOMEN: Soft, non-tender, non-distended MUSCULOSKELETAL:  No edema; No deformity  SKIN: Warm and dry LOWER EXTREMITIES: no swelling NEUROLOGIC:  Alert and oriented x 3 PSYCHIATRIC:  Normal affect   ASSESSMENT:    1. Coronary artery  disease of native artery of native heart with stable angina pectoris (HCC)   2. Essential hypertension   3. Mixed hyperlipidemia    PLAN:    In order of problems listed above:  Coronary artery disease history of complaint of having some chest pain.  He took nitroglycerin  which helped him I put him on Imdur 30 and bring him back within the next 3 to 4 weeks to see if he is gets any better. Dyspnea on exertion multifactorial.  Now we have sensation of chest pain which make me worry that he may have worsening of his coronary artery disease.  Will see how he responds to therapy but consider another cardiac catheterization. Dyslipidemia: I did review K PN which show me LDL 69 HDL 38 we will continue present management   Medication Adjustments/Labs and Tests Ordered: Current medicines are reviewed at length with the patient today.  Concerns regarding medicines are outlined above.  No orders of the defined types were placed in this encounter.  Medication changes:  Meds ordered this encounter  Medications   isosorbide mononitrate (IMDUR) 30 MG 24 hr tablet    Sig: Take 1 tablet (30 mg total) by mouth daily.    Dispense:  90 tablet    Refill:  3    Signed, Manfred Seed, MD, [facility] 10/25/2023 4:38 PM    [facility]

## 2023-10-25 NOTE — Patient Instructions (Addendum)
 Medication Instructions:   START: Imdur 30mg  1 tablet daily   Lab Work: None Ordered If you have labs (blood work) drawn today and your tests are completely normal, you will receive your results only by: MyChart Message (if you have MyChart) OR A paper copy in the mail If you have any lab test that is abnormal or we need to change your treatment, we will call you to review the results.   Testing/Procedures: None Ordered   Follow-Up: At [facility], you and your health needs are our priority.  As part of our continuing mission to provide you with exceptional heart care, we have created designated Provider Care Teams.  These Care Teams include your primary Cardiologist (physician) and Advanced Practice Providers (APPs -  Physician Assistants and Nurse Practitioners) who all work together to provide you with the care you need, when you need it.  We recommend signing up for the patient portal called "MyChart".  Sign up information is provided on this After Visit Summary.  MyChart is used to connect with patients for Virtual Visits (Telemedicine).  Patients are able to view lab/test results, encounter notes, upcoming appointments, etc.  Non-urgent messages can be sent to your provider as well.   To learn more about what you can do with MyChart, go to [email or web address].    Your next appointment:   3 week(s)  The format for your next appointment:   In Person  Provider:   Gypsy Balsam, MD    Other Instructions NA

## 2023-11-14 ENCOUNTER — Encounter: Payer: Self-pay | Admitting: Cardiology

## 2023-11-14 ENCOUNTER — Ambulatory Visit: Attending: Cardiology | Admitting: Cardiology

## 2023-11-14 VITALS — BP 130/80 | HR 77 | Ht 72.0 in | Wt 367.0 lb

## 2023-11-14 DIAGNOSIS — I5032 Chronic diastolic (congestive) heart failure: Secondary | ICD-10-CM | POA: Diagnosis not present

## 2023-11-14 DIAGNOSIS — E785 Hyperlipidemia, unspecified: Secondary | ICD-10-CM

## 2023-11-14 DIAGNOSIS — I25118 Atherosclerotic heart disease of native coronary artery with other forms of angina pectoris: Secondary | ICD-10-CM

## 2023-11-14 DIAGNOSIS — I10 Essential (primary) hypertension: Secondary | ICD-10-CM | POA: Diagnosis not present

## 2023-11-14 MED ORDER — ISOSORBIDE MONONITRATE ER 60 MG PO TB24: 60.0000 mg | ORAL_TABLET | Freq: Every day | ORAL | 3 refills | Status: DC

## 2023-11-14 NOTE — Patient Instructions (Signed)
 Medication Instructions:  Your physician has recommended you make the following change in your medication:   Increase your Imdur  from 30 mg to 60 mg daily  *If you need a refill on your cardiac medications before your next appointment, please call your pharmacy*   Lab Work: None ordered If you have labs (blood work) drawn today and your tests are completely normal, you will receive your results only by: MyChart Message (if you have MyChart) OR A paper copy in the mail If you have any lab test that is abnormal or we need to change your treatment, we will call you to review the results.   Testing/Procedures: None ordered   Follow-Up: At [facility], you and your health needs are our priority.  As part of our continuing mission to provide you with exceptional heart care, we have created designated Provider Care Teams.  These Care Teams include your primary Cardiologist (physician) and Advanced Practice Providers (APPs -  Physician Assistants and Nurse Practitioners) who all work together to provide you with the care you need, when you need it.  We recommend signing up for the patient portal called MyChart.  Sign up information is provided on this After Visit Summary.  MyChart is used to connect with patients for Virtual Visits (Telemedicine).  Patients are able to view lab/test results, encounter notes, upcoming appointments, etc.  Non-urgent messages can be sent to your provider as well.   To learn more about what you can do with MyChart, go to [email or web address].    Your next appointment:   3 month(s)  The format for your next appointment:   In Person  Provider:   Lamar Fitch, MD    Other Instructions none  Important Information About Sugar

## 2023-11-14 NOTE — Progress Notes (Signed)
 Cardiology Office Note:    Date:  11/14/2023   ID:  Jerome Phelps, DOB 12/21/1960, MRN [id number]  PCP:  Nena Rosina CROME, PA-C  Cardiologist:  Lamar Fitch, MD    Referring MD: Nena Rosina CROME, PA-C   Chief Complaint  Patient presents with   Follow-up    History of Present Illness:    Jerome Phelps is a 63 y.o. male past medical history significant for coronary artery disease, in 2023 he did have cardiac catheterization done which showed 50% posterior lateral branch 70% obtuse marginal branch.  He was also found to have elevated BNP peak.  Started patient with diuretics, however still complain of not feeling well.  Last time I have seen him we just started to try nitroglycerin  and put him on Imdur  30, she comes today to my office she feels significantly better.  He is shortness of breath and chest pain improved quite significantly.  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   4.3 cm   Baker's cyst 04/19/2021   Carotid artery occlusion    CHF (congestive heart failure) (HCC)    Chronic pain of both knees 05/29/2017   CKD (chronic kidney disease) stage 3, GFR 30-59 ml/min (HCC) 2024   Coronary artery disease 11/10/2016   Cardiac catheterization done in 2014 showing 20% LAD   Diastolic congestive heart failure (HCC)    Dyslipidemia 11/10/2016   Dyspnea on exertion 03/25/2021   Essential hypertension 08/22/2021   Hyperlipidemia    Low HDL (under 40) 03/24/2019   Lumbar radiculopathy 11/04/2013   Mixed simple and mucopurulent chronic bronchitis (HCC) 10/25/2023   Morbid obesity (HCC) 12/11/2016   Myocardial infarction (HCC)    10/21/22   NSTEMI (non-ST elevated myocardial infarction) (HCC) 10/21/2021   Overweight 10/27/2021   Peripheral neuropathy, idiopathic 11/04/2013   Pure hypercholesterolemia    S/P TKR (total knee replacement) using cement, left 09/11/2019   Sleep apnea    Umbilical hernia without obstruction and without  gangrene 07/18/2022   Venous insufficiency     Past Surgical History:  Procedure Laterality Date   CORONARY ANGIOPLASTY     Right/Left   KNEE SURGERY Left    LEFT HEART CATH AND CORONARY ANGIOGRAPHY N/A 10/21/2021   Procedure: LEFT HEART CATH AND CORONARY ANGIOGRAPHY;  Surgeon: Dann Candyce RAMAN, MD;  Location: [facility] INVASIVE CV LAB;  Service: Cardiovascular;  Laterality: N/A;    Current Medications: Current Meds  Medication Sig   aspirin  EC 81 MG tablet Take 1 tablet (81 mg total) by mouth daily. Swallow whole.   carvedilol  (COREG ) 6.25 MG tablet Take 1 tablet (6.25 mg total) by mouth 2 (two) times daily with a meal.   clopidogrel  (PLAVIX ) 75 MG tablet TAKE ONE TABLET BY MOUTH EVERY DAY   furosemide  (LASIX ) 40 MG tablet Take 1 tablet (40 mg total) by mouth daily. May take extra dose in the evening for shortness of breath.   isosorbide  mononitrate (IMDUR ) 30 MG 24 hr tablet Take 1 tablet (30 mg total) by mouth daily.   meloxicam  (MOBIC ) 15 MG tablet Take 15 mg by mouth daily.   multivitamin (ONE-A-DAY MEN'S) TABS tablet Take 1 tablet by mouth daily with breakfast.   nitroGLYCERIN  (NITROSTAT ) 0.4 MG SL tablet Place 1 tablet (0.4 mg total) under the tongue every 5 (five) minutes as needed for chest pain.   Potassium Chloride  ER 20 MEQ TBCR TAKE 1 TABLET BY MOUTH DAILY     Allergies:   Atorvastatin   Social History   Socioeconomic History   Marital status: Married    Spouse name: Not on file   Number of children: 1   Years of education: Not on file   Highest education level: Not on file  Occupational History   Not on file  Tobacco Use   Smoking status: Never   Smokeless tobacco: Never  Vaping Use   Vaping status: Never Used  Substance and Sexual Activity   Alcohol use: No   Drug use: No   Sexual activity: Not on file  Other Topics Concern   Not on file  Social History Narrative   Not on file   Social Drivers of Health   Financial Resource Strain: Not on file  Food  Insecurity: Not on file  Transportation Needs: Not on file  Physical Activity: Not on file  Stress: Not on file  Social Connections: Unknown (09/18/2021)   Received from [facility]   Social Network    Social Network: Not on file     Family History: The patient's family history includes Lung cancer in his father; Thyroid  cancer in his mother. There is no history of Colon cancer, Colon polyps, Esophageal cancer, Rectal cancer, or Stomach cancer. ROS:   Please see the history of present illness.    All 14 point review of systems negative except as described per history of present illness  EKGs/Labs/Other Studies Reviewed:    EKG Interpretation Date/Time:  Wednesday November 14 2023 09:08:39 EDT Ventricular Rate:  77 PR Interval:  168 QRS Duration:  86 QT Interval:  394 QTC Calculation: 445 R Axis:   6  Text Interpretation: Normal sinus rhythm Normal ECG When compared with ECG of 20-Apr-2023 16:04, No significant change was found Confirmed by Bernie Charleston [phone]) on 11/14/2023 9:18:48 AM    Recent Labs: 07/26/2023: BUN 15; Creatinine, Ser 1.01; NT-Pro BNP 74; Potassium 5.1; Sodium 141; TSH 2.280  Recent Lipid Panel    Component Value Date/Time   CHOL 128 08/31/2022 0850   TRIG 117 08/31/2022 0850   HDL 38 (L) 08/31/2022 0850   CHOLHDL 3.4 08/31/2022 0850   CHOLHDL 3.8 10/23/2021 0930   VLDL 31 10/23/2021 0930   LDLCALC 69 08/31/2022 0850    Physical Exam:    VS:  BP 130/80   Pulse 77   Ht 6' (1.829 m)   Wt (!) 367 lb (166.5 kg)   SpO2 93%   BMI 49.77 kg/m     Wt Readings from Last 3 Encounters:  11/14/23 (!) 367 lb (166.5 kg)  10/25/23 (!) 364 lb (165.1 kg)  07/26/23 (!) 361 lb 9.6 oz (164 kg)     GEN:  Well nourished, well developed in no acute distress HEENT: Normal NECK: No JVD; No carotid bruits LYMPHATICS: No lymphadenopathy CARDIAC: RRR, no murmurs, no rubs, no gallops RESPIRATORY:  Clear to auscultation without rales, wheezing or rhonchi  ABDOMEN:  Soft, non-tender, non-distended MUSCULOSKELETAL:  No edema; No deformity  SKIN: Warm and dry LOWER EXTREMITIES: no swelling NEUROLOGIC:  Alert and oriented x 3 PSYCHIATRIC:  Normal affect   ASSESSMENT:    1. Coronary artery disease of native artery of native heart with stable angina pectoris (HCC)   2. Chronic diastolic congestive heart failure (HCC)   3. Essential hypertension   4. Dyslipidemia    PLAN:    In order of problems listed above:  Coronary disease, nonobstructive on cardiac catheterization from 2023 but I put him on Imdur  empirically with improvement will increase dose  of Imdur  from 30 to 60 mg daily see him back in about 3 months. Chronic diastolic congestive heart failure compensated proBNP normal continue present management. Essential hypertension seems to be well-controlled continue present management. Dyslipidemia I did review K PN which show me total cholesterol 128 HDL 38 LDL 69 we will continue present management   Medication Adjustments/Labs and Tests Ordered: Current medicines are reviewed at length with the patient today.  Concerns regarding medicines are outlined above.  Orders Placed This Encounter  Procedures   EKG 12-Lead   Medication changes: No orders of the defined types were placed in this encounter.   Signed, Lamar DOROTHA Fitch, MD, [facility] 11/14/2023 9:30 AM    [facility]

## 2023-11-14 NOTE — Addendum Note (Signed)
 Addended by: ONEITA BERLINER on: 11/14/2023 09:45 AM   Modules accepted: Orders

## 2023-11-16 ENCOUNTER — Telehealth: Payer: Self-pay | Admitting: Cardiology

## 2023-11-16 NOTE — Telephone Encounter (Signed)
 Patient picked up Imdur  60mg  from pharmacy, directions on bottle is to take 1 tablet a day but said he was told by Dr. MARLA to take 2 a day and would just like clarification. CB # [phone]

## 2023-11-16 NOTE — Telephone Encounter (Signed)
 Spoke with pt to clarify that he should be increasing Imdur  from 30mg  to 60mg  daily

## 2023-11-19 NOTE — Telephone Encounter (Signed)
 Pt c/o medication issue:  1. Name of Medication:   isosorbide  mononitrate (IMDUR ) 60 MG 24 hr tablet   2. How are you currently taking this medication (dosage and times per day)?   As prescribed  3. Are you having a reaction (difficulty breathing--STAT)?   4. What is your medication issue?   Patient stated he had chest pains 3-4 times since he started taking the 60 mg dosage.  Patient stated he took a 30 mg tablet today and has not had any chest pain issue.  Patient wants advice on next steps.

## 2023-11-20 ENCOUNTER — Telehealth: Payer: Self-pay

## 2023-11-20 MED ORDER — ISOSORBIDE MONONITRATE ER 60 MG PO TB24: 30.0000 mg | ORAL_TABLET | Freq: Every day | ORAL | 3 refills | Status: AC

## 2023-11-20 NOTE — Telephone Encounter (Signed)
 Pt called. He was having issues with chest pain after starting increased dose of imdur . Dr. Krasowski recommended that he go back to his previous dose of 30mg  q d

## 2024-01-20 ENCOUNTER — Ambulatory Visit (HOSPITAL_BASED_OUTPATIENT_CLINIC_OR_DEPARTMENT_OTHER)
Admission: EM | Admit: 2024-01-20 | Discharge: 2024-01-20 | Disposition: A | Discharge status: Home / Self Care | Attending: Family Medicine | Admitting: Family Medicine

## 2024-01-20 ENCOUNTER — Encounter (HOSPITAL_BASED_OUTPATIENT_CLINIC_OR_DEPARTMENT_OTHER): Payer: Self-pay

## 2024-01-20 DIAGNOSIS — L723 Sebaceous cyst: Secondary | ICD-10-CM | POA: Diagnosis not present

## 2024-01-20 DIAGNOSIS — L089 Local infection of the skin and subcutaneous tissue, unspecified: Secondary | ICD-10-CM | POA: Diagnosis not present

## 2024-01-20 MED ORDER — CEPHALEXIN 500 MG PO CAPS: 500.0000 mg | ORAL_CAPSULE | Freq: Three times a day (TID) | ORAL | 0 refills | Status: AC

## 2024-01-20 NOTE — Discharge Instructions (Signed)
 Take the antibiotics as prescribed for infection.  Warm compresses to the area. Follow-up as needed

## 2024-01-20 NOTE — ED Triage Notes (Signed)
 Patient has an abscess to center of back. Slight drainage. Notice 1 week ago.  Patient to have a heart cath on 10/23 and concerned needs antibiotics.

## 2024-01-20 NOTE — ED Provider Notes (Signed)
 Jerome Phelps    CSN: [id number] Arrival date & time: 01/20/24  0919      History   Chief Complaint Chief Complaint  Patient presents with   Abscess    HPI Jerome Phelps is a 63 y.o. male.   Patient is a 63 year old male who presents today with abscess.  Patient has an abscess to center of back. Slight drainage. Noticed 1 week ago.  Wife did get some drainage out of the area for him.  They were concerned because it was not healing and very inflamed.  History of similar in the past but usually resolves.  No fever or chills.   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   4.3 cm   Baker's cyst 04/19/2021   Carotid artery occlusion    CHF (congestive heart failure) (HCC)    Chronic pain of both knees 05/29/2017   CKD (chronic kidney disease) stage 3, GFR 30-59 ml/min (HCC) 2024   Coronary artery disease 11/10/2016   Cardiac catheterization done in 2014 showing 20% LAD   Diastolic congestive heart failure (HCC)    Dyslipidemia 11/10/2016   Dyspnea on exertion 03/25/2021   Essential hypertension 08/22/2021   Hyperlipidemia    Low HDL (under 40) 03/24/2019   Lumbar radiculopathy 11/04/2013   Mixed simple and mucopurulent chronic bronchitis (HCC) 10/25/2023   Morbid obesity (HCC) 12/11/2016   Myocardial infarction (HCC)    10/21/22   NSTEMI (non-ST elevated myocardial infarction) (HCC) 10/21/2021   Overweight 10/27/2021   Peripheral neuropathy, idiopathic 11/04/2013   Pure hypercholesterolemia    S/P TKR (total knee replacement) using cement, left 09/11/2019   Sleep apnea    Umbilical hernia without obstruction and without gangrene 07/18/2022   Venous insufficiency     Patient Active Problem List   Diagnosis Date Noted   Mixed simple and mucopurulent chronic bronchitis (HCC) 10/25/2023   CHF (congestive heart failure) (HCC) 08/24/2022   Myocardial infarction (HCC) 08/24/2022   Umbilical hernia without  obstruction and without gangrene 07/18/2022   Hyperlipidemia 07/18/2022   CKD (chronic kidney disease) stage 3, GFR 30-59 ml/min (HCC) 2024   Overweight 10/27/2021   Pure hypercholesterolemia    Diastolic congestive heart failure (HCC)    NSTEMI (non-ST elevated myocardial infarction) (HCC) 10/21/2021   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   Essential hypertension 08/22/2021   Baker's cyst 04/19/2021   Dyspnea on exertion 03/25/2021   Venous insufficiency 03/23/2021   Carotid artery occlusion 03/23/2021   S/P TKR (total knee replacement) using cement, left 09/11/2019   Low HDL (under 40) 03/24/2019   Chronic pain of both knees 05/29/2017   Morbid obesity (HCC) 12/11/2016   Coronary artery disease 11/10/2016   Sleep apnea 11/10/2016   Dyslipidemia 11/10/2016   Lumbar radiculopathy 11/04/2013   Peripheral neuropathy, idiopathic 11/04/2013    Past Surgical History:  Procedure Laterality Date   CORONARY ANGIOPLASTY     Right/Left   KNEE SURGERY Left    LEFT HEART CATH AND CORONARY ANGIOGRAPHY N/A 10/21/2021   Procedure: LEFT HEART CATH AND CORONARY ANGIOGRAPHY;  Surgeon: Dann Candyce RAMAN, MD;  Location: [facility] INVASIVE CV LAB;  Service: Cardiovascular;  Laterality: N/A;       Home Medications    Prior to Admission medications   Medication Sig Start Date End Date Taking? Authorizing Provider  cephALEXin  (KEFLEX ) 500 MG capsule Take 1 capsule (500 mg total) by mouth 3 (three) times daily for 7 days. 01/20/24  01/27/24 Yes Tiras Bianchini A, FNP  aspirin  EC 81 MG tablet Take 1 tablet (81 mg total) by mouth daily. Swallow whole. 10/24/21   Meng, Hao, PA  carvedilol  (COREG ) 6.25 MG tablet Take 1 tablet (6.25 mg total) by mouth 2 (two) times daily with a meal. 05/23/23   Madireddy, Alean SAUNDERS, MD  clopidogrel  (PLAVIX ) 75 MG tablet TAKE ONE TABLET BY MOUTH EVERY DAY 11/07/22   Carlin Delon BROCKS, NP  furosemide  (LASIX ) 40 MG tablet Take 1 tablet (40 mg total) by mouth daily. May take extra  dose in the evening for shortness of breath. 11/30/22   Carlin Delon BROCKS, NP  isosorbide  mononitrate (IMDUR ) 60 MG 24 hr tablet Take 0.5 tablets (30 mg total) by mouth daily. 11/20/23   Krasowski, Robert J, MD  meloxicam  (MOBIC ) 15 MG tablet Take 15 mg by mouth daily. 11/06/23   [provider]  multivitamin (ONE-A-DAY MEN'S) TABS tablet Take 1 tablet by mouth daily with breakfast.    [provider]  nitroGLYCERIN  (NITROSTAT ) 0.4 MG SL tablet Place 1 tablet (0.4 mg total) under the tongue every 5 (five) minutes as needed for chest pain. 09/24/23   Madireddy, Alean SAUNDERS, MD  Potassium Chloride  ER 20 MEQ TBCR TAKE 1 TABLET BY MOUTH DAILY 09/25/23   Bernie Lamar PARAS, MD    Family History Family History  Problem Relation Age of Onset   Thyroid  cancer Mother    Lung cancer Father    Colon cancer Neg Hx    Colon polyps Neg Hx    Esophageal cancer Neg Hx    Rectal cancer Neg Hx    Stomach cancer Neg Hx     Social History Social History   Tobacco Use   Smoking status: Never   Smokeless tobacco: Never  Vaping Use   Vaping status: Never Used  Substance Use Topics   Alcohol use: No   Drug use: No     Allergies   Atorvastatin    Review of Systems Review of Systems See HPI  Physical Exam Triage Vital Signs ED Triage Vitals  Encounter Vitals Group     BP 01/20/24 1105 (!) 163/83     Girls Systolic BP Percentile --      Girls Diastolic BP Percentile --      Boys Systolic BP Percentile --      Boys Diastolic BP Percentile --      Pulse Rate 01/20/24 1105 (!) 53     Resp 01/20/24 1105 20     Temp 01/20/24 1105 97.7 F (36.5 C)     Temp Source 01/20/24 1105 Oral     SpO2 01/20/24 1105 95 %     Weight --      Height --      Head Circumference --      Peak Flow --      Pain Score 01/20/24 1106 0     Pain Loc --      Pain Education --      Exclude from Growth Chart --    No data found.  Updated Vital Signs BP (!) 163/83 (BP Location: Right Arm)   Pulse  (!) 53   Temp 97.7 F (36.5 C) (Oral)   Resp 20   SpO2 95%   Visual Acuity Right Eye Distance:   Left Eye Distance:   Bilateral Distance:    Right Eye Near:   Left Eye Near:    Bilateral Near:     Physical Exam Constitutional:  Appearance: Normal appearance.  Pulmonary:     Effort: Pulmonary effort is normal.  Musculoskeletal:        General: Normal range of motion.  Skin:    General: Skin is warm and dry.      Neurological:     Mental Status: He is alert.  Psychiatric:        Mood and Affect: Mood normal.      UC Treatments / Results  Labs (all labs ordered are listed, but only abnormal results are displayed) Labs Reviewed - No data to display  EKG   Radiology No results found.  Procedures Procedures (including critical Phelps time)  Medications Ordered in UC Medications - No data to display  Initial Impression / Assessment and Plan / UC Course  I have reviewed the triage vital signs and the nursing notes.  Pertinent labs & imaging results that were available during my Phelps of the patient were reviewed by me and considered in my medical decision making (see chart for details).     Infected sebaceous cyst with surrounding cellulitis.  Will go ahead and treat with Keflex  at this time since it is not resolving.  Continue warm compresses. Follow-up as needed Final Clinical Impressions(s) / UC Diagnoses   Final diagnoses:  Infected sebaceous cyst     Discharge Instructions      Take the antibiotics as prescribed for infection.  Warm compresses to the area. Follow-up as needed   ED Prescriptions     Medication Sig Dispense Auth. Provider   cephALEXin  (KEFLEX ) 500 MG capsule Take 1 capsule (500 mg total) by mouth 3 (three) times daily for 7 days. 21 capsule Adah Wilbert LABOR, FNP      PDMP not reviewed this encounter.   Adah Wilbert LABOR, FNP 01/20/24 1158

## 2024-01-25 DIAGNOSIS — I2089 Other forms of angina pectoris: Secondary | ICD-10-CM | POA: Diagnosis not present

## 2024-01-25 DIAGNOSIS — R0609 Other forms of dyspnea: Secondary | ICD-10-CM | POA: Diagnosis not present

## 2024-01-25 DIAGNOSIS — I252 Old myocardial infarction: Secondary | ICD-10-CM | POA: Diagnosis not present

## 2024-01-25 DIAGNOSIS — R002 Palpitations: Secondary | ICD-10-CM | POA: Diagnosis not present

## 2024-01-29 DIAGNOSIS — I252 Old myocardial infarction: Secondary | ICD-10-CM | POA: Diagnosis not present

## 2024-01-29 DIAGNOSIS — I25118 Atherosclerotic heart disease of native coronary artery with other forms of angina pectoris: Secondary | ICD-10-CM | POA: Diagnosis not present

## 2024-01-29 DIAGNOSIS — E785 Hyperlipidemia, unspecified: Secondary | ICD-10-CM | POA: Diagnosis not present

## 2024-01-29 DIAGNOSIS — Z6841 Body Mass Index (BMI) 40.0 and over, adult: Secondary | ICD-10-CM | POA: Diagnosis not present

## 2024-01-29 DIAGNOSIS — Z79899 Other long term (current) drug therapy: Secondary | ICD-10-CM | POA: Diagnosis not present

## 2024-01-29 DIAGNOSIS — E669 Obesity, unspecified: Secondary | ICD-10-CM | POA: Diagnosis not present

## 2024-03-04 DIAGNOSIS — M1711 Unilateral primary osteoarthritis, right knee: Secondary | ICD-10-CM | POA: Diagnosis not present

## 2024-03-04 DIAGNOSIS — G8929 Other chronic pain: Secondary | ICD-10-CM | POA: Diagnosis not present

## 2024-03-04 DIAGNOSIS — M25561 Pain in right knee: Secondary | ICD-10-CM | POA: Diagnosis not present

## 2024-03-07 DIAGNOSIS — E782 Mixed hyperlipidemia: Secondary | ICD-10-CM | POA: Diagnosis not present

## 2024-03-07 DIAGNOSIS — I2510 Atherosclerotic heart disease of native coronary artery without angina pectoris: Secondary | ICD-10-CM | POA: Diagnosis not present

## 2024-06-04 ENCOUNTER — Other Ambulatory Visit: Payer: Self-pay
